# Patient Record
Sex: Male | Born: 1975 | Race: White | Hispanic: No | Marital: Married | Smoking: Never smoker
Health system: Southern US, Community
[De-identification: ages and names within clinical notes are randomized; demographics above are authoritative.]

## PROBLEM LIST (undated history)

## (undated) DIAGNOSIS — F419 Anxiety disorder, unspecified: Secondary | ICD-10-CM

## (undated) HISTORY — PX: APPENDECTOMY: SHX54

## (undated) HISTORY — PX: EYE SURGERY: SHX253

---

## 2018-07-18 ENCOUNTER — Other Ambulatory Visit: Payer: Self-pay

## 2018-07-18 ENCOUNTER — Encounter (HOSPITAL_COMMUNITY): Payer: Self-pay

## 2018-07-18 ENCOUNTER — Emergency Department (HOSPITAL_COMMUNITY): Payer: Managed Care, Other (non HMO)

## 2018-07-18 ENCOUNTER — Inpatient Hospital Stay (HOSPITAL_COMMUNITY)
Admission: EM | Admit: 2018-07-18 | Discharge: 2018-07-21 | DRG: 494 | Disposition: A | Discharge status: Home / Self Care | Payer: Managed Care, Other (non HMO) | Attending: Orthopaedic Surgery | Admitting: Orthopaedic Surgery

## 2018-07-18 DIAGNOSIS — M79661 Pain in right lower leg: Secondary | ICD-10-CM | POA: Diagnosis present

## 2018-07-18 DIAGNOSIS — S82251A Displaced comminuted fracture of shaft of right tibia, initial encounter for closed fracture: Secondary | ICD-10-CM | POA: Diagnosis present

## 2018-07-18 DIAGNOSIS — Z9049 Acquired absence of other specified parts of digestive tract: Secondary | ICD-10-CM

## 2018-07-18 DIAGNOSIS — S82451A Displaced comminuted fracture of shaft of right fibula, initial encounter for closed fracture: Secondary | ICD-10-CM | POA: Diagnosis present

## 2018-07-18 DIAGNOSIS — S82401A Unspecified fracture of shaft of right fibula, initial encounter for closed fracture: Secondary | ICD-10-CM

## 2018-07-18 DIAGNOSIS — Y92018 Other place in single-family (private) house as the place of occurrence of the external cause: Secondary | ICD-10-CM | POA: Diagnosis not present

## 2018-07-18 DIAGNOSIS — T148XXA Other injury of unspecified body region, initial encounter: Secondary | ICD-10-CM

## 2018-07-18 DIAGNOSIS — W108XXA Fall (on) (from) other stairs and steps, initial encounter: Secondary | ICD-10-CM | POA: Diagnosis present

## 2018-07-18 DIAGNOSIS — W19XXXA Unspecified fall, initial encounter: Secondary | ICD-10-CM

## 2018-07-18 DIAGNOSIS — S82201A Unspecified fracture of shaft of right tibia, initial encounter for closed fracture: Secondary | ICD-10-CM | POA: Diagnosis not present

## 2018-07-18 HISTORY — DX: Anxiety disorder, unspecified: F41.9

## 2018-07-18 MED ORDER — DIPHENHYDRAMINE HCL 12.5 MG/5ML PO ELIX: 12.5000 mg | ORAL_SOLUTION | ORAL | Status: DC | PRN

## 2018-07-18 MED ORDER — HYDROMORPHONE HCL 1 MG/ML IJ SOLN
1.0000 mg | INTRAMUSCULAR | Status: DC | PRN
  Administered 2018-07-18 – 2018-07-19 (×4): 1 mg via INTRAVENOUS
  Filled 2018-07-18 (×4): qty 1

## 2018-07-18 MED ORDER — PROPOFOL 10 MG/ML IV BOLUS
70.0000 mg | Freq: Once | INTRAVENOUS | Status: AC
  Administered 2018-07-18: 70 mg via INTRAVENOUS
  Filled 2018-07-18: qty 20

## 2018-07-18 MED ORDER — OXYCODONE HCL 5 MG PO TABS
5.0000 mg | ORAL_TABLET | ORAL | Status: DC | PRN
  Administered 2018-07-19 (×2): 10 mg via ORAL
  Filled 2018-07-18 (×2): qty 2

## 2018-07-18 MED ORDER — METHOCARBAMOL 500 MG PO TABS
500.0000 mg | ORAL_TABLET | Freq: Four times a day (QID) | ORAL | Status: DC | PRN
  Administered 2018-07-19 – 2018-07-21 (×6): 500 mg via ORAL
  Filled 2018-07-18 (×5): qty 1

## 2018-07-18 MED ORDER — SODIUM CHLORIDE 0.9 % IV SOLN
INTRAVENOUS | Status: DC
  Administered 2018-07-19: 16:00:00 via INTRAVENOUS

## 2018-07-18 MED ORDER — METHOCARBAMOL 1000 MG/10ML IJ SOLN
500.0000 mg | Freq: Four times a day (QID) | INTRAVENOUS | Status: DC | PRN
  Filled 2018-07-18: qty 5

## 2018-07-18 MED ORDER — PROPOFOL 10 MG/ML IV BOLUS
INTRAVENOUS | Status: AC | PRN
  Administered 2018-07-18: 35 mg via INTRAVENOUS

## 2018-07-18 MED ORDER — FENTANYL CITRATE (PF) 100 MCG/2ML IJ SOLN
50.0000 ug | INTRAMUSCULAR | Status: DC | PRN
  Administered 2018-07-18: 50 ug via INTRAVENOUS
  Filled 2018-07-18: qty 2

## 2018-07-18 NOTE — H&P (Signed)
Rick Wolf is an 43 y.o. male.   Chief Complaint: Right lower leg injury  HPI: 43 year old male who fell earlier this evening while carrying a small TV down stairs. Denies LOC, dizziness or other injury except the right lower leg. Patient arrive to ER with splint in place that was placed by EMS. Reports last meal at 7 PM tonight.   Past Medical History:  Diagnosis Date  . Anxiety     Past Surgical History:  Procedure Laterality Date  . APPENDECTOMY    . EYE SURGERY      History reviewed. No pertinent family history. Social History:  reports that he has never smoked. He has never used smokeless tobacco. He reports current alcohol use. He reports that he does not use drugs.  Allergies:  Allergies  Allergen Reactions  . Shellfish Allergy Anaphylaxis      No results found for this or any previous visit (from the past 48 hour(s)). Dg Tibia/fibula Right Port  Result Date: 07/18/2018 CLINICAL DATA:  Rick Wolf today, pain at mid tibia and fibula, deformity EXAM: PORTABLE RIGHT TIBIA AND FIBULA - 2 VIEW COMPARISON:  None FINDINGS: Osseous mineralization normal. Knee and ankle joint alignments normal. Oblique fracture of the RIGHT tibial diaphysis at the junction of the middle and distal thirds, with apex medial and anterior angulation and slight lateral displacement. Comminuted fracture of the RIGHT fibular diaphysis adjacent to the tibial fracture with similar anterior and medial apex angulation and slightly greater degree of displacement. No additional fractures, dislocation, or bone destruction. IMPRESSION: Displaced angulated fractures of the mid to distal RIGHT tibial and fibular diaphyses. Electronically Signed   By: Rick SouthwardMark  Wolf M.D.   On: 07/18/2018 21:21    Review of Systems  Constitutional: Negative for chills and fever.  Respiratory: Negative for shortness of breath.   Cardiovascular: Negative for chest pain.  Neurological: Negative for dizziness and loss of consciousness.     Blood pressure (!) 150/102, pulse (!) 105, temperature 98.1 F (36.7 C), temperature source Oral, resp. rate 16, height 6' (1.829 m), weight 95.3 kg, SpO2 99 %. Physical Exam  Constitutional: He is oriented to person, place, and time. He appears well-developed and well-nourished. No distress.  Cardiovascular: Intact distal pulses.  Respiratory: Effort normal.  Musculoskeletal:     Comments: Right lower leg obvious deformity of the distal tib-fib.  Tenting of the skin with valgus deformity.  Nontender at the ankle and right knee.  Compartments are soft throughout.  Sensation grossly intact light touch throughout the foot.  Neurological: He is alert and oriented to person, place, and time.  Skin: Skin is warm and dry. He is not diaphoretic.  Psychiatric: He has a normal mood and affect.     Assessment/Plan Distal right tib-fib closed  Fracture. Plan patient was consented for conscious sedation and closed reduction in the ER.  Close reduction was performed with Rick Wolf and long posterior splint with a stirrup was applied.  Patient tolerated well. We will plan for definitive treatment to tomorrow with an IM nailing of the  Right tibia.  Questions were encouraged and answered by Rick Wolf myself.  Patient was seen and examined by Rick Wolf.  Rick CanalGILBERT Kelis Plasse, PA-C 07/18/2018, 9:41 PM

## 2018-07-18 NOTE — ED Notes (Signed)
Ortho at bedside.

## 2018-07-18 NOTE — ED Triage Notes (Signed)
Pt walking down stairs, tripped on pill bottle and rolled R ankle. Obvious deformity to R ankle, splinted on scene. rec'd 150 mcgs fentanyl by EMS. +CMS to R foot on arrival.

## 2018-07-18 NOTE — ED Provider Notes (Signed)
[facility]   CSN: [id number] Arrival date & time: 07/18/18  2031     History   Chief Complaint Chief Complaint  Patient presents with  . Leg Pain    HPI Rick Wolf is a 43 y.o. male.  HPI Patient presents after mechanical fall with pain and deformity in his right lower leg. Patient was in his usual state of health, when he slipped, falling while going down the stairs. He felt sudden onset of pain, since that time has had discomfort in the mid to distal right leg, without loss of sensation. Patient received 100 mcg of fentanyl in route, notes that his pain is now 3/10. Pain is sharp, severe, worse with motion. Patient denies other trauma, injuries, other complaints, loss of consciousness. EMS notes the patient had more pronounced deformity on arrival, but after placement of splint had improved alignment of his right lower extremity.    Past medical history, anxiety Past surgical history appendectomy  Home Medications    Ativan, Zoloft    Family History No family history on file.  Social History Lives with wife, Non-smoker  Allergies   Patient has no allergy information on record.   Review of Systems Review of Systems  Constitutional:       Per HPI, otherwise negative  HENT:       Per HPI, otherwise negative  Respiratory:       Per HPI, otherwise negative  Cardiovascular:       Per HPI, otherwise negative  Gastrointestinal: Negative for vomiting.  Endocrine:       Negative aside from HPI  Genitourinary:       Neg aside from HPI   Musculoskeletal:       Per HPI, otherwise negative  Skin: Negative.   Neurological: Negative for syncope.     Physical Exam Updated Vital Signs BP (!) 143/90 (BP Location: Left Arm)   Pulse (!) 114   Temp 98.1 F (36.7 C) (Oral)   Resp 17   Ht 6' (1.829 m)   Wt 95.3 kg   SpO2 100%   BMI 28.48 kg/m   Physical Exam Vitals signs and nursing note reviewed.    Constitutional:      General: He is not in acute distress.    Appearance: He is well-developed.  HENT:     Head: Normocephalic and atraumatic.  Eyes:     Conjunctiva/sclera: Conjunctivae normal.  Cardiovascular:     Rate and Rhythm: Normal rate and regular rhythm.     Comments: Appreciable dorsalis pedis pulse,right foot, and posterior tibial pulse on same. Pulmonary:     Effort: Pulmonary effort is normal. No respiratory distress.     Breath sounds: No stridor.  Abdominal:     General: There is no distension.  Musculoskeletal:       Legs:  Skin:    General: Skin is warm and dry.  Neurological:     Mental Status: He is alert and oriented to person, place, and time.      ED Treatments / Results   Radiology Dg Tibia/fibula Right Port  Result Date: 07/18/2018 CLINICAL DATA:  [name], pain at mid tibia and fibula, deformity EXAM: PORTABLE RIGHT TIBIA AND FIBULA - 2 VIEW COMPARISON:  None FINDINGS: Osseous mineralization normal. Knee and ankle joint alignments normal. Oblique fracture of the RIGHT tibial diaphysis at the junction of the middle and distal thirds, with apex medial and anterior angulation and slight lateral displacement. Comminuted  fracture of the RIGHT fibular diaphysis adjacent to the tibial fracture with similar anterior and medial apex angulation and slightly greater degree of displacement. No additional fractures, dislocation, or bone destruction. IMPRESSION: Displaced angulated fractures of the mid to distal RIGHT tibial and fibular diaphyses. Electronically Signed   By: Ulyses Southward M.D.   On: 07/18/2018 21:21    Procedures .Sedation Date/Time: 07/18/2018 10:00 PM Performed by: Gerhard Munch, MD Authorized by: Gerhard Munch, MD   Consent:    Consent obtained:  Verbal   Consent given by:  Patient   Risks discussed:  Allergic reaction, dysrhythmia, inadequate sedation, nausea, prolonged hypoxia resulting in organ damage, prolonged sedation necessitating  reversal, respiratory compromise necessitating ventilatory assistance and intubation and vomiting   Alternatives discussed:  Analgesia without sedation, anxiolysis and regional anesthesia Universal protocol:    Procedure explained and questions answered to patient or proxy's satisfaction: yes     Relevant documents present and verified: yes     Test results available and properly labeled: yes     Imaging studies available: yes     Required blood products, implants, devices, and special equipment available: yes     Site/side marked: yes     Immediately prior to procedure a time out was called: yes     Patient identity confirmation method:  Verbally with patient Indications:    Procedure necessitating sedation performed by:  Physician performing sedation Pre-sedation assessment:    Time since last food or drink:  3   ASA classification: class 1 - normal, healthy patient     Neck mobility: normal     Mouth opening:  3 or more finger widths   Thyromental distance:  4 finger widths   Mallampati score:  I - soft palate, uvula, fauces, pillars visible   Pre-sedation assessments completed and reviewed: airway patency, cardiovascular function, hydration status, mental status, nausea/vomiting, pain level, respiratory function and temperature     Pre-sedation assessment completed:  07/18/2018 10:00 PM Immediate pre-procedure details:    Reassessment: Patient reassessed immediately prior to procedure     Reviewed: vital signs, relevant labs/tests and NPO status     Verified: bag valve mask available, emergency equipment available, intubation equipment available, IV patency confirmed, oxygen available and suction available   Procedure details (see MAR for exact dosages):    Preoxygenation:  Nasal cannula   Sedation:  Propofol   Intra-procedure monitoring:  Blood pressure monitoring, cardiac monitor, continuous pulse oximetry, frequent LOC assessments, frequent vital sign checks and continuous  capnometry   Intra-procedure events: none     Total Provider sedation time (minutes):  25 Post-procedure details:    Post-sedation assessment completed:  07/18/2018 11:00 PM   Attendance: Constant attendance by certified staff until patient recovered     Recovery: Patient returned to pre-procedure baseline     Post-sedation assessments completed and reviewed: airway patency, cardiovascular function, hydration status, mental status, nausea/vomiting, pain level, respiratory function and temperature     Patient is stable for discharge or admission: yes     Patient tolerance:  Tolerated well, no immediate complications   (including critical care time)  Medications Ordered in ED Medications  fentaNYL (SUBLIMAZE) injection 50 mcg (has no administration in time range)     Initial Impression / Assessment and Plan / ED Course  I have reviewed the triage vital signs and the nursing notes.  Pertinent labs & imaging results that were available during my care of the patient were reviewed by me and considered in  my medical decision making (see chart for details).  After my initial evaluation I reviewed the patient's x-ray, discussed with her orthopedist. Together, we discussed findings with the patient. Patient has had only few hours since dinner, is not medically clear for general anesthesia, operative repair. We discussed importance of reduction for minimization of risk of compartment syndrome, and the patient voiced understanding of risks and benefits of conscious sedation, with reduction. Patient tolerated this procedure well, with myself performing the sedation, the orthopedist performing the reduction Splint applied by orthopedic technician. Patient admitted to the orthopedic team for surgical repair tomorrow of his tib-fib fracture.  Final Clinical Impressions(s) / ED Diagnoses  Fall, initial encounter Fracture of tibia and fibula, right , initial encounter   Gerhard MunchLockwood, Eman Morimoto, MD 07/19/18  [phone]

## 2018-07-18 NOTE — Sedation Documentation (Signed)
Successful reduction by MD Magnus Ivan. Ortho Tech T Wall at bedside placing splint. Pt remains alert and conversational

## 2018-07-18 NOTE — Progress Notes (Signed)
Orthopedic Tech Progress Note Patient Details:  Rick Wolf 05-30-1976 [id number]  Ortho Devices Type of Ortho Device: Post (long) splint Ortho Device/Splint Interventions: Adjustment, Application, Ordered   Post Interventions Patient Tolerated: Well Instructions Provided: Adjustment of device, Care of device   Norva Karvonen T 07/18/2018, 9:37 PM

## 2018-07-18 NOTE — ED Notes (Signed)
Ortho tech paged per orthopedist request

## 2018-07-19 ENCOUNTER — Inpatient Hospital Stay (HOSPITAL_COMMUNITY): Payer: Managed Care, Other (non HMO)

## 2018-07-19 ENCOUNTER — Inpatient Hospital Stay (HOSPITAL_COMMUNITY): Payer: Managed Care, Other (non HMO) | Admitting: Certified Registered Nurse Anesthetist

## 2018-07-19 ENCOUNTER — Encounter (HOSPITAL_COMMUNITY)
Admission: EM | Disposition: A | Discharge status: Home / Self Care | Payer: Self-pay | Source: Home / Self Care | Attending: Orthopaedic Surgery

## 2018-07-19 ENCOUNTER — Encounter (HOSPITAL_COMMUNITY): Payer: Self-pay | Admitting: Emergency Medicine

## 2018-07-19 DIAGNOSIS — S82401A Unspecified fracture of shaft of right fibula, initial encounter for closed fracture: Secondary | ICD-10-CM

## 2018-07-19 DIAGNOSIS — S82201A Unspecified fracture of shaft of right tibia, initial encounter for closed fracture: Secondary | ICD-10-CM

## 2018-07-19 HISTORY — PX: TIBIA IM NAIL INSERTION: SHX2516

## 2018-07-19 LAB — CBC
HCT: 40.3 % (ref 39.0–52.0)
Hemoglobin: 13.9 g/dL (ref 13.0–17.0)
MCH: 30.4 pg (ref 26.0–34.0)
MCHC: 34.5 g/dL (ref 30.0–36.0)
MCV: 88.2 fL (ref 80.0–100.0)
Platelets: 271 10*3/uL (ref 150–400)
RBC: 4.57 MIL/uL (ref 4.22–5.81)
RDW: 11.9 % (ref 11.5–15.5)
WBC: 12.7 10*3/uL — ABNORMAL HIGH (ref 4.0–10.5)
nRBC: 0 % (ref 0.0–0.2)

## 2018-07-19 LAB — HIV ANTIBODY (ROUTINE TESTING W REFLEX): HIV Screen 4th Generation wRfx: NONREACTIVE

## 2018-07-19 LAB — COMPREHENSIVE METABOLIC PANEL
ALT: 27 U/L (ref 0–44)
AST: 23 U/L (ref 15–41)
Albumin: 3.7 g/dL (ref 3.5–5.0)
Alkaline Phosphatase: 50 U/L (ref 38–126)
Anion gap: 10 (ref 5–15)
BUN: 12 mg/dL (ref 6–20)
CO2: 24 mmol/L (ref 22–32)
Calcium: 8.7 mg/dL — ABNORMAL LOW (ref 8.9–10.3)
Chloride: 103 mmol/L (ref 98–111)
Creatinine, Ser: 1.01 mg/dL (ref 0.61–1.24)
GFR calc Af Amer: >60 mL/min (ref >60)
GFR calc non Af Amer: >60 mL/min (ref >60)
Glucose, Bld: 119 mg/dL — ABNORMAL HIGH (ref 70–99)
Potassium: 4.1 mmol/L (ref 3.5–5.1)
Sodium: 137 mmol/L (ref 135–145)
Total Bilirubin: 0.8 mg/dL (ref 0.3–1.2)
Total Protein: 6.7 g/dL (ref 6.5–8.1)

## 2018-07-19 LAB — MRSA PCR SCREENING: MRSA by PCR: NEGATIVE

## 2018-07-19 SURGERY — INSERTION, INTRAMEDULLARY ROD, TIBIA
Anesthesia: General | Laterality: Right

## 2018-07-19 MED ORDER — FENTANYL CITRATE (PF) 250 MCG/5ML IJ SOLN
INTRAMUSCULAR | Status: AC
  Filled 2018-07-19: qty 5

## 2018-07-19 MED ORDER — LIDOCAINE 2% (20 MG/ML) 5 ML SYRINGE
INTRAMUSCULAR | Status: AC
  Filled 2018-07-19: qty 5

## 2018-07-19 MED ORDER — DEXAMETHASONE SODIUM PHOSPHATE 10 MG/ML IJ SOLN
INTRAMUSCULAR | Status: DC | PRN
  Administered 2018-07-19: 10 mg via INTRAVENOUS

## 2018-07-19 MED ORDER — DEXAMETHASONE SODIUM PHOSPHATE 10 MG/ML IJ SOLN
INTRAMUSCULAR | Status: AC
  Filled 2018-07-19: qty 1

## 2018-07-19 MED ORDER — PHENYLEPHRINE 40 MCG/ML (10ML) SYRINGE FOR IV PUSH (FOR BLOOD PRESSURE SUPPORT)
PREFILLED_SYRINGE | INTRAVENOUS | Status: AC
  Filled 2018-07-19: qty 10

## 2018-07-19 MED ORDER — METHOCARBAMOL 500 MG PO TABS
ORAL_TABLET | ORAL | Status: AC
  Administered 2018-07-19: 500 mg
  Filled 2018-07-19: qty 1

## 2018-07-19 MED ORDER — FENTANYL CITRATE (PF) 100 MCG/2ML IJ SOLN: 50.0000 ug | INTRAMUSCULAR | Status: DC | PRN

## 2018-07-19 MED ORDER — BUPIVACAINE HCL (PF) 0.25 % IJ SOLN
INTRAMUSCULAR | Status: AC
  Filled 2018-07-19: qty 30

## 2018-07-19 MED ORDER — CEFAZOLIN SODIUM-DEXTROSE 2-3 GM-%(50ML) IV SOLR
INTRAVENOUS | Status: DC | PRN
  Administered 2018-07-19: 2 g via INTRAVENOUS

## 2018-07-19 MED ORDER — OXYCODONE HCL 5 MG PO TABS
ORAL_TABLET | ORAL | Status: AC
  Administered 2018-07-19: 5 mg
  Filled 2018-07-19: qty 2

## 2018-07-19 MED ORDER — LACTATED RINGERS IV SOLN
INTRAVENOUS | Status: DC | PRN
  Administered 2018-07-19 (×2): via INTRAVENOUS

## 2018-07-19 MED ORDER — ROCURONIUM BROMIDE 50 MG/5ML IV SOSY
PREFILLED_SYRINGE | INTRAVENOUS | Status: DC | PRN
  Administered 2018-07-19: 50 mg via INTRAVENOUS

## 2018-07-19 MED ORDER — CEFAZOLIN SODIUM-DEXTROSE 1-4 GM/50ML-% IV SOLN
1.0000 g | Freq: Three times a day (TID) | INTRAVENOUS | Status: AC
  Administered 2018-07-19 (×2): 1 g via INTRAVENOUS
  Filled 2018-07-19 (×2): qty 50

## 2018-07-19 MED ORDER — PHENYLEPHRINE 40 MCG/ML (10ML) SYRINGE FOR IV PUSH (FOR BLOOD PRESSURE SUPPORT)
PREFILLED_SYRINGE | INTRAVENOUS | Status: DC | PRN
  Administered 2018-07-19: 40 ug via INTRAVENOUS

## 2018-07-19 MED ORDER — ONDANSETRON HCL 4 MG/2ML IJ SOLN
INTRAMUSCULAR | Status: AC
  Filled 2018-07-19: qty 2

## 2018-07-19 MED ORDER — KETOROLAC TROMETHAMINE 15 MG/ML IJ SOLN
INTRAMUSCULAR | Status: AC
  Administered 2018-07-19: 15 mg
  Filled 2018-07-19: qty 1

## 2018-07-19 MED ORDER — FENTANYL CITRATE (PF) 250 MCG/5ML IJ SOLN
INTRAMUSCULAR | Status: DC | PRN
  Administered 2018-07-19 (×2): 50 ug via INTRAVENOUS
  Administered 2018-07-19: 100 ug via INTRAVENOUS
  Administered 2018-07-19: 50 ug via INTRAVENOUS

## 2018-07-19 MED ORDER — MIDAZOLAM HCL 2 MG/2ML IJ SOLN
INTRAMUSCULAR | Status: AC
  Filled 2018-07-19: qty 2

## 2018-07-19 MED ORDER — LIDOCAINE 2% (20 MG/ML) 5 ML SYRINGE
INTRAMUSCULAR | Status: DC | PRN
  Administered 2018-07-19: 60 mg via INTRAVENOUS

## 2018-07-19 MED ORDER — 0.9 % SODIUM CHLORIDE (POUR BTL) OPTIME
TOPICAL | Status: DC | PRN
  Administered 2018-07-19: 1000 mL

## 2018-07-19 MED ORDER — PROPOFOL 10 MG/ML IV BOLUS
INTRAVENOUS | Status: AC
  Filled 2018-07-19: qty 20

## 2018-07-19 MED ORDER — ONDANSETRON HCL 4 MG/2ML IJ SOLN
INTRAMUSCULAR | Status: DC | PRN
  Administered 2018-07-19: 4 mg via INTRAVENOUS

## 2018-07-19 MED ORDER — PROPOFOL 10 MG/ML IV BOLUS
INTRAVENOUS | Status: DC | PRN
  Administered 2018-07-19: 160 mg via INTRAVENOUS

## 2018-07-19 MED ORDER — BUPIVACAINE HCL (PF) 0.5 % IJ SOLN
INTRAMUSCULAR | Status: AC
  Filled 2018-07-19: qty 30

## 2018-07-19 MED ORDER — KETOROLAC TROMETHAMINE 15 MG/ML IJ SOLN
15.0000 mg | Freq: Four times a day (QID) | INTRAMUSCULAR | Status: DC
  Administered 2018-07-19 – 2018-07-21 (×8): 15 mg via INTRAVENOUS
  Filled 2018-07-19 (×8): qty 1

## 2018-07-19 MED ORDER — MIDAZOLAM HCL 5 MG/5ML IJ SOLN
INTRAMUSCULAR | Status: DC | PRN
  Administered 2018-07-19: 2 mg via INTRAVENOUS

## 2018-07-19 MED ORDER — SUGAMMADEX SODIUM 200 MG/2ML IV SOLN
INTRAVENOUS | Status: DC | PRN
  Administered 2018-07-19: 200 mg via INTRAVENOUS

## 2018-07-19 MED ORDER — OXYCODONE HCL 5 MG PO TABS
5.0000 mg | ORAL_TABLET | ORAL | Status: DC | PRN
  Administered 2018-07-19 – 2018-07-21 (×10): 10 mg via ORAL
  Filled 2018-07-19 (×9): qty 2

## 2018-07-19 MED ORDER — CEFAZOLIN SODIUM-DEXTROSE 2-4 GM/100ML-% IV SOLN
INTRAVENOUS | Status: AC
  Filled 2018-07-19: qty 100

## 2018-07-19 SURGICAL SUPPLY — 59 items
BANDAGE ACE 4X5 VEL STRL LF (GAUZE/BANDAGES/DRESSINGS) ×3 IMPLANT
BANDAGE ACE 6X5 VEL STRL LF (GAUZE/BANDAGES/DRESSINGS) ×3 IMPLANT
BIT DRILL AO GAMMA 4.2X180 (BIT) ×3 IMPLANT
BIT DRILL AO GAMMA 4.2X340 (BIT) ×3 IMPLANT
COVER MAYO STAND STRL (DRAPES) ×3 IMPLANT
COVER SURGICAL LIGHT HANDLE (MISCELLANEOUS) ×3 IMPLANT
COVER WAND RF STERILE (DRAPES) ×3 IMPLANT
DRAPE C-ARM 42X72 X-RAY (DRAPES) ×3 IMPLANT
DRAPE HALF SHEET 40X57 (DRAPES) ×6 IMPLANT
DRAPE IMP U-DRAPE 54X76 (DRAPES) ×3 IMPLANT
DRAPE ORTHO SPLIT 77X108 STRL (DRAPES) ×2
DRAPE SURG ORHT 6 SPLT 77X108 (DRAPES) ×1 IMPLANT
DURAPREP 26ML APPLICATOR (WOUND CARE) ×6 IMPLANT
ELECT REM PT RETURN 9FT ADLT (ELECTROSURGICAL) ×3
ELECTRODE REM PT RTRN 9FT ADLT (ELECTROSURGICAL) ×1 IMPLANT
GAUZE SPONGE 4X4 12PLY STRL (GAUZE/BANDAGES/DRESSINGS) ×3 IMPLANT
GAUZE XEROFORM 5X9 LF (GAUZE/BANDAGES/DRESSINGS) ×3 IMPLANT
GLOVE BIO SURGEON STRL SZ8 (GLOVE) ×3 IMPLANT
GLOVE BIOGEL PI IND STRL 8 (GLOVE) ×2 IMPLANT
GLOVE BIOGEL PI INDICATOR 8 (GLOVE) ×4
GLOVE ORTHO TXT STRL SZ7.5 (GLOVE) ×3 IMPLANT
GOWN STRL REUS W/ TWL XL LVL3 (GOWN DISPOSABLE) ×3 IMPLANT
GOWN STRL REUS W/TWL XL LVL3 (GOWN DISPOSABLE) ×6
GUIDEROD T2 3X1000 (ROD) ×3 IMPLANT
GUIDEWIRE GAMMA (WIRE) ×6 IMPLANT
K-WIRE FIXATION 3X285 COATED (WIRE) ×6
KIT BASIN OR (CUSTOM PROCEDURE TRAY) ×3 IMPLANT
KIT TURNOVER KIT B (KITS) ×3 IMPLANT
KWIRE FIXATION 3X285 COATED (WIRE) ×2 IMPLANT
NAIL ELAS INSERT SLV SPI 8-11 (MISCELLANEOUS) ×3 IMPLANT
NAIL TIBIAL STANDARD 11X375 (Nail) ×3 IMPLANT
NAIL TIBIAL STD 11X375 (Nail) ×1 IMPLANT
NS IRRIG 1000ML POUR BTL (IV SOLUTION) ×3 IMPLANT
PACK ORTHO EXTREMITY (CUSTOM PROCEDURE TRAY) ×3 IMPLANT
PACK UNIVERSAL I (CUSTOM PROCEDURE TRAY) ×3 IMPLANT
PAD ABD 8X10 STRL (GAUZE/BANDAGES/DRESSINGS) ×6 IMPLANT
PAD ARMBOARD 7.5X6 YLW CONV (MISCELLANEOUS) ×3 IMPLANT
PAD CAST 4YDX4 CTTN HI CHSV (CAST SUPPLIES) ×1 IMPLANT
PADDING CAST COTTON 4X4 STRL (CAST SUPPLIES) ×2
REAMER INTRAMEDULLARY 8MM 510 (MISCELLANEOUS) ×3 IMPLANT
SCREW LOCKING T2 F/T  5MMX40MM (Screw) ×2 IMPLANT
SCREW LOCKING T2 F/T  5MMX50MM (Screw) ×2 IMPLANT
SCREW LOCKING T2 F/T  5X37.5MM (Screw) ×2 IMPLANT
SCREW LOCKING T2 F/T 5MMX40MM (Screw) ×1 IMPLANT
SCREW LOCKING T2 F/T 5MMX50MM (Screw) ×1 IMPLANT
SCREW LOCKING T2 F/T 5X37.5MM (Screw) ×1 IMPLANT
STAPLER VISISTAT 35W (STAPLE) ×3 IMPLANT
STOCKINETTE IMPERVIOUS LG (DRAPES) ×3 IMPLANT
SUT VIC AB 0 CT1 27 (SUTURE) ×2
SUT VIC AB 0 CT1 27XBRD ANBCTR (SUTURE) ×1 IMPLANT
SUT VIC AB 1 CT1 27 (SUTURE) ×2
SUT VIC AB 1 CT1 27XBRD ANBCTR (SUTURE) ×1 IMPLANT
SUT VIC AB 2-0 CT1 27 (SUTURE) ×2
SUT VIC AB 2-0 CT1 TAPERPNT 27 (SUTURE) ×1 IMPLANT
TOWEL OR 17X26 10 PK STRL BLUE (TOWEL DISPOSABLE) ×3 IMPLANT
TUBE CONNECTING 12'X1/4 (SUCTIONS) ×1
TUBE CONNECTING 12X1/4 (SUCTIONS) ×2 IMPLANT
WATER STERILE IRR 1000ML POUR (IV SOLUTION) ×3 IMPLANT
YANKAUER SUCT BULB TIP NO VENT (SUCTIONS) ×3 IMPLANT

## 2018-07-19 NOTE — Brief Op Note (Signed)
07/18/2018 - 07/19/2018  11:43 AM  PATIENT:  Rick Wolf  43 y.o. male  PRE-OPERATIVE DIAGNOSIS:  RIGHT TIBIA-FIBULA FRACTURE  POST-OPERATIVE DIAGNOSIS:  RIGHT TIBIA-FIBULA FRACTURE  PROCEDURE:  Procedure(s): INTRAMEDULLARY (IM) NAIL TIBIAL, RIGHT (Right)  SURGEON:  Surgeon(s) and Role:    * Kathryne Hitch, MD - Primary  PHYSICIAN ASSISTANT: Rexene Edison, PA-C  ANESTHESIA:   local and general  EBL:  100 mL   COUNTS:  YES  DICTATION: .Other Dictation: Dictation Number [id number]  PLAN OF CARE: Admit to inpatient   PATIENT DISPOSITION:  PACU - hemodynamically stable.   Delay start of Pharmacological VTE agent (>24hrs) due to surgical blood loss or risk of bleeding: no

## 2018-07-19 NOTE — Anesthesia Postprocedure Evaluation (Signed)
Anesthesia Post Note  Patient: Rick Wolf  Procedure(s) Performed: INTRAMEDULLARY (IM) NAIL TIBIAL, RIGHT (Right )     Patient location during evaluation: PACU Anesthesia Type: General Level of consciousness: awake Pain management: pain level controlled Vital Signs Assessment: post-procedure vital signs reviewed and stable Respiratory status: spontaneous breathing Cardiovascular status: stable Postop Assessment: no apparent nausea or vomiting Anesthetic complications: no    Last Vitals:  Vitals:   07/19/18 1231 07/19/18 1256  BP: (!) 142/87 122/81  Pulse: (!) 103 92  Resp: 17   Temp: 36.7 C 36.7 C  SpO2: 96% 97%    Last Pain:  Vitals:   07/19/18 1256  TempSrc: Oral  PainSc:                  Lanaysia Fritchman

## 2018-07-19 NOTE — Progress Notes (Signed)
Patient ID: Rick Wolf, male   DOB: October 02, 1975, 43 y.o.   MRN: [id number] The patient understands fully that we will proceed to surgery today for definitive fixation of his right tibia fracture with an IM nail/rod.  The surgery has been explained in detail as well as the risks and benefits involved.  He is comfortable appearing this am and denies right foot numbness.  He moves his toes and they are well perfused.

## 2018-07-19 NOTE — Anesthesia Postprocedure Evaluation (Signed)
Anesthesia Post Note  Patient: Rick Wolf  Procedure(s) Performed: INTRAMEDULLARY (IM) NAIL TIBIAL, RIGHT (Right )     Patient location during evaluation: PACU Anesthesia Type: General Level of consciousness: awake Pain management: pain level controlled Vital Signs Assessment: post-procedure vital signs reviewed and stable Respiratory status: spontaneous breathing Cardiovascular status: stable Postop Assessment: no apparent nausea or vomiting Anesthetic complications: no    Last Vitals:  Vitals:   07/19/18 1231 07/19/18 1256  BP: (!) 142/87 122/81  Pulse: (!) 103 92  Resp: 17   Temp: 36.7 C 36.7 C  SpO2: 96% 97%    Last Pain:  Vitals:   07/19/18 1256  TempSrc: Oral  PainSc:                  [name]     

## 2018-07-19 NOTE — Anesthesia Procedure Notes (Signed)
Procedure Name: Intubation Date/Time: 07/19/2018 10:14 AM Performed by: Nils Pyle, CRNA Pre-anesthesia Checklist: Patient identified, Emergency Drugs available, Suction available and Patient being monitored Patient Re-evaluated:Patient Re-evaluated prior to induction Oxygen Delivery Method: Circle System Utilized Preoxygenation: Pre-oxygenation with 100% oxygen Induction Type: IV induction Ventilation: Mask ventilation without difficulty and Oral airway inserted - appropriate to patient size Laryngoscope Size: Hyacinth Meeker and 2 Grade View: Grade I Tube type: Oral Tube size: 7.5 mm Number of attempts: 1 Airway Equipment and Method: Stylet and Oral airway Placement Confirmation: ETT inserted through vocal cords under direct vision,  positive ETCO2 and breath sounds checked- equal and bilateral Secured at: 23 cm Tube secured with: Tape Dental Injury: Teeth and Oropharynx as per pre-operative assessment

## 2018-07-19 NOTE — Anesthesia Preprocedure Evaluation (Addendum)
Anesthesia Evaluation  Patient identified by MRN, date of birth, ID band Patient awake  General Assessment Comment:History noted   Reviewed: Allergy & Precautions, NPO status , Patient's Chart, lab work & pertinent test results  Airway Mallampati: II  TM Distance: >3 FB     Dental   Pulmonary neg pulmonary ROS,    breath sounds clear to auscultation       Cardiovascular negative cardio ROS   Rhythm:Regular Rate:Normal     Neuro/Psych    GI/Hepatic negative GI ROS, Neg liver ROS,   Endo/Other  negative endocrine ROS  Renal/GU negative Renal ROS     Musculoskeletal   Abdominal   Peds  Hematology   Anesthesia Other Findings   Reproductive/Obstetrics                            Anesthesia Physical Anesthesia Plan  ASA: II  Anesthesia Plan: General   Post-op Pain Management:    Induction: Intravenous  PONV Risk Score and Plan: Ondansetron, Dexamethasone and Midazolam  Airway Management Planned: Oral ETT  Additional Equipment:   Intra-op Plan:   Post-operative Plan: Possible Post-op intubation/ventilation  Informed Consent: I have reviewed the patients History and Physical, chart, labs and discussed the procedure including the risks, benefits and alternatives for the proposed anesthesia with the patient or authorized representative who has indicated his/her understanding and acceptance.     Dental advisory given  Plan Discussed with: CRNA, Anesthesiologist and Surgeon  Anesthesia Plan Comments:        Anesthesia Quick Evaluation

## 2018-07-19 NOTE — Transfer of Care (Signed)
Immediate Anesthesia Transfer of Care Note  Patient: Rick Wolf  Procedure(s) Performed: INTRAMEDULLARY (IM) NAIL TIBIAL, RIGHT (Right )  Patient Location: PACU  Anesthesia Type:General  Level of Consciousness: awake, alert  and oriented  Airway & Oxygen Therapy: Patient Spontanous Breathing  Post-op Assessment: Report given to RN, Post -op Vital signs reviewed and stable and Patient moving all extremities X 4  Post vital signs: Reviewed and stable  Last Vitals:  Vitals Value Taken Time  BP 135/81 07/19/2018 12:00 PM  Temp    Pulse 101 07/19/2018 12:00 PM  Resp 14 07/19/2018 12:00 PM  SpO2 94 % 07/19/2018 12:00 PM  Vitals shown include unvalidated device data.  Last Pain:  Vitals:   07/19/18 0647  TempSrc:   PainSc: 0-No pain         Complications: No apparent anesthesia complications

## 2018-07-20 NOTE — Op Note (Signed)
NAMENEON, VANDERWALL MEDICAL RECORD [id number] ACCOUNT [id number] DATE OF BIRTH:Feb 05, 1976 FACILITY: [facility] LOCATION: [facility] PHYSICIAN:Terrisa Curfman Aretha Parrot, MD  OPERATIVE REPORT  DATE OF PROCEDURE:  07/19/2018  PREOPERATIVE DIAGNOSIS:  Right tibia/fibula shaft fracture.  POSTOPERATIVE DIAGNOSIS:  Right tibia/fibula shaft fracture.  PROCEDURE:  Intramedullary nail placement, right tibia.  IMPLANTS:  Stryker T2 suprapatellar approach tibial nail measuring 11 cm x 375 cm, 1 proximal and 2 distal interlocking screws.  SURGEON:  Vanita Panda. Magnus Ivan, MD  ASSISTANT:  Richardean Canal, PA-C.  ANESTHESIA:  General.  ANTIBIOTICS:  Two grams IV Ancef.  ESTIMATED BLOOD LOSS:  200 mL.  COMPLICATIONS:  None.  INDICATIONS:  The patient is a 43 year old gentleman who unfortunately fell on the stairs yesterday injuring his right leg.  We saw him immediately afterwards in  the emergency room.  He had no evidence of compartment syndrome.  He did have a midshaft to  distal third shaft tib-fib fracture.  This was closed, but was tenting the skin.  He had just eaten prior to his accident, so we had a full stomach.  With no evidence of compartment syndrome, we did give him a little bit of sedation and improve the  alignment of the bone.  He had no soft tissue injuries last night.  His foot remained well perfused with normal sensation.  He can bend his toes back and forth in a splint.  We set him up for surgery then for this morning and admitted him overnight to  watch him with close neurovascular checks to make sure he did not develop compartment syndrome.  I felt with low mechanism of injury, it likely would not develop; however, we did watch him closely.  He is now presenting for definitive surgery this  morning.  He still denies any numbness and tingling in his foot.  His right foot was well perfusing, moves his toes easily.  I did explain in detail what the surgery involves.  We talked about  intramedullary nail placement versus ____.    We talked about  the risk of nonoperative and operative treatment as well as the risks of surgery itself.  DESCRIPTION OF PROCEDURE:  After a thorough discussion was had and informed consent was obtained, we marked the right leg and he was brought back to the operating room and placed supine on the operating table.  General anesthesia was then obtained.  His  right thigh, knee, leg, ankle and foot were all prepped and draped with DuraPrep and sterile drapes.    Of note, we took the splint down, he did have fracture blisters that had developed at the original site where the skin was tented from the original fracture.  We did unroof those blisters and cleaned thoroughly.    A time-out was called and he was identified as the correct patient, correct right tibia.  We then made our suprapatellar approach to the tibia making an incision just proximal to the superior pole of the patella.  We dissected down the knee joint and he  did have a significantly large hemarthrosis.  We irrigated this out.  Using the soft tissue protection guide, we then were able to place a temporary pin through the top of the tibia.  We verified its placement under AP and lateral planes using  fluoroscopic guidance.  We then were able to use initiating reamer to open up the proximal tibia.  We then placed a temporary guide rod all the way down the tibia traversing the fracture site into the  ankle.  Down at the ankle, we confirmed placement and  then the fracture, infection, reduction.  We then began reaming in 5 mm increments from 9 mm all the way up to 12.5 mm  because as we got to 11 mm, he did not have any significant chatter, so this did show that his tibial canal was certainly more open.   I felt it was more  appropriate then to ream to a 12.5 diameter for an 11 mm diameter nail.  We did ream all the way up to a 12.5.  We then took a measurement off of the guide pin at the top of the  tibia and we chose our 11 mm x 375 mm tibial nail.  We  placed this nail over the guide rod without difficulty and held the fracture reduced past the fracture site and then removed the guide rod.  We then used the outrigger guide proximally and put a single screw from lateral to medial and then distally, we  did 2 distal interlocking screws from medial to lateral.  I felt that his rotational stability was on and 3 interlocks were done that were needed because of very tight fill in the canal.  We then irrigated all wounds with normal saline solution.  We  closed the quad tendon with #1 Vicryl suture followed by 0 Vicryl in the deep tissue, 2-0 Vicryl subcutaneous tissue and interrupted staples on all skin incisions.  Xeroform well-padded soft dressing was applied.  He was not placed back in a type of  splint.    He was then awakened, extubated, and taken to recovery room in stable condition.    All final counts were correct.    There were no complications noted.    Note Rexene Edison, PA-C, assisted during the entire case and assistance was crucial for facilitating all aspects of this case.    Postoperatively, we are going to let the patient potentially attempt weightbearing as tolerated and it depends on his level of comfort.  AN/NUANCE  D:07/19/2018 T:07/20/2018 JOB:[id number]

## 2018-07-20 NOTE — Progress Notes (Signed)
Occupational Therapy Evaluation Patient Details Name: Rick Wolf MRN: [id number] DOB: 04-01-1976 Today's Date: 07/20/2018    History of Present Illness Patient is a 43 year old male admitted after fall on steps at home. Sustained a R tib/fib fracture, S/P ORIF.     Clinical Impression   PTA pt PLOF I in all ADLs and IADLs, lives with wife and family. Pt currently requires Min Guard for functional transfer for safety due to limitation of RLE. Supervision LB ADLs. Pt will benefit from continued acute therapy for education for home safety and compensatory techniques maximize independence in ADLs and transition to home setting safely. No OT follow up required. OT will continue to follow acutely.    Follow Up Recommendations  No OT follow up    Equipment Recommendations       Recommendations for Other Services       Precautions / Restrictions Precautions Precautions: Fall Restrictions Weight Bearing Restrictions: Yes RLE Weight Bearing: Weight bearing as tolerated      Mobility Bed Mobility Overal bed mobility: Independent             General bed mobility comments: Pt able to demonstrate good bed mobiltiy no signs of low strength or pain.   Transfers Overall transfer level: Needs assistance Equipment used: Rolling walker (2 wheeled) Transfers: Sit to/from Stand Sit to Stand: Min guard         General transfer comment: VCs for hand placement and sequeicng to power to stand with RW.    Balance Overall balance assessment: Needs assistance Sitting-balance support: No upper extremity supported Sitting balance-Leahy Scale: Good     Standing balance support: Bilateral upper extremity supported Standing balance-Leahy Scale: (Pt reliant of RW.)                             ADL either performed or assessed with clinical judgement   ADL Overall ADL's : Needs assistance/impaired Eating/Feeding: Independent   Grooming: Wash/dry hands;Oral  care;Independent               Lower Body Dressing: Supervision/safety;Sitting/lateral leans   Toilet Transfer: Min guard;Cueing for safety;Ambulation;RW   Toileting- Clothing Manipulation and Hygiene: Modified independent       Functional mobility during ADLs: Min guard;Cueing for sequencing;Rolling walker General ADL Comments: Pt able to bear weight on RLE.Min guard for safety patient able to follow commands of sequencing with 1 reminder.     Vision         Perception     Praxis      Pertinent Vitals/Pain Pain Assessment: 0-10 Pain Score: 3  Faces Pain Scale: Hurts little more Pain Descriptors / Indicators: Aching;Discomfort Pain Intervention(s): Monitored during session;Premedicated before session     Hand Dominance     Extremity/Trunk Assessment Upper Extremity Assessment Upper Extremity Assessment: Overall WFL for tasks assessed   Lower Extremity Assessment Lower Extremity Assessment: Defer to PT evaluation   Cervical / Trunk Assessment Cervical / Trunk Assessment: Normal   Communication Communication Communication: No difficulties   Cognition Arousal/Alertness: Awake/alert Behavior During Therapy: WFL for tasks assessed/performed Overall Cognitive Status: Within Functional Limits for tasks assessed                                     General Comments       Exercises     Shoulder Instructions  Home Living Family/patient expects to be discharged to:: Private residence Living Arrangements: Spouse/significant other   Type of Home: House Home Access: Level entry     Home Layout: Two level;Able to live on main level with bedroom/bathroom     Bathroom Shower/Tub: Producer, television/film/video: Handicapped height Bathroom Accessibility: Yes   Home Equipment: Shower seat(Shower bench built in to shower.)   Additional Comments: Dan Humphreys is able to maneuver in bathroom.      Prior Functioning/Environment Level of  Independence: Independent                 OT Problem List: Impaired balance (sitting and/or standing);Decreased safety awareness;Decreased knowledge of use of DME or AE;Decreased knowledge of precautions;Pain      OT Treatment/Interventions:      OT Goals(Current goals can be found in the care plan section) Acute Rehab OT Goals Patient Stated Goal: to return home OT Goal Formulation: With patient Time For Goal Achievement: 08/03/18 Potential to Achieve Goals: Good  OT Frequency: Min 2X/week   Barriers to D/C:            Co-evaluation              AM-PAC OT "6 Clicks" Daily Activity     Outcome Measure Help from another person eating meals?: None Help from another person taking care of personal grooming?: None Help from another person toileting, which includes using toliet, bedpan, or urinal?: None Help from another person bathing (including washing, rinsing, drying)?: None Help from another person to put on and taking off regular upper body clothing?: None Help from another person to put on and taking off regular lower body clothing?: A Little 6 Click Score: 23   End of Session Equipment Utilized During Treatment: Gait belt;Rolling walker Nurse Communication: Mobility status;Weight bearing status  Activity Tolerance: Patient tolerated treatment well Patient left: in bed(with PT.)  OT Visit Diagnosis: Unsteadiness on feet (R26.81);Other abnormalities of gait and mobility (R26.89);Pain Pain - Right/Left: Right Pain - part of body: Leg                Time: 4431-5400 OT Time Calculation (min): 19 min Charges:  OT General Charges $OT Visit: 1 Visit OT Evaluation $OT Eval Low Complexity: 1 Low  [place] Old, MSOT, OTR/L  Supplemental Rehabilitation Services  [phone]  Zigmund Daniel 07/20/2018, 2:07 PM

## 2018-07-20 NOTE — Plan of Care (Signed)
  Problem: Education: Goal: Knowledge of the prescribed therapeutic regimen will improve Outcome: Progressing   Problem: Activity: Goal: Ability to increase mobility will improve Outcome: Progressing   Problem: Physical Regulation: Goal: Postoperative complications will be avoided or minimized Outcome: Progressing   Problem: Skin Integrity: Goal: Will show signs of wound healing Outcome: Progressing

## 2018-07-20 NOTE — Evaluation (Signed)
Physical Therapy Evaluation Patient Details Name: Rick Wolf MRN: [id number] DOB: [birth year] Today's Date: 07/20/2018   History of Present Illness  Patient is a 43 year old male admitted after fall on steps at home. Sustained a R tib/fib fracture, S/P ORIF.    Clinical Impression  Patient standing at bathroom sink with OT upon arrival. Patient reports minimal pain in right LE. Able to tolerate some weight bearing. Transfers sit to stand with min guard.  Agrees to walk in hallway 200' with rw and supervision, cues for sequencing needed. Good balance and safety awareness demonstrated. Patient will benefit from continued PT while here to improve functional independence, strength and safety with AD for return home.       Follow Up Recommendations No PT follow up    Equipment Recommendations  Crutches;Rolling walker with 5" wheels(dependent on progress, preference)    Recommendations for Other Services       Precautions / Restrictions Precautions Precautions: Fall Restrictions Weight Bearing Restrictions: Yes RLE Weight Bearing: Weight bearing as tolerated      Mobility  Bed Mobility               General bed mobility comments: Not assessed, patient up with OT in bathroom when arrived. Remained in recliner at end of session.   Transfers Overall transfer level: Modified independent Equipment used: Rolling walker (2 wheeled)                Ambulation/Gait Ambulation/Gait assistance: Modified independent (Device/Increase time) Gait Distance (Feet): 200 Feet Assistive device: Rolling walker (2 wheeled) Gait Pattern/deviations: Step-to pattern;Decreased stance time - right;Decreased weight shift to right Gait velocity: decreased      Stairs            Wheelchair Mobility    Modified Rankin (Stroke Patients Only)       Balance Overall balance assessment: Modified Independent                                           Pertinent  Vitals/Pain Pain Assessment: Faces Faces Pain Scale: Hurts little more Pain Descriptors / Indicators: Aching;Discomfort Pain Intervention(s): Limited activity within patient's tolerance;Monitored during session;Ice applied    Home Living Family/patient expects to be discharged to:: Private residence Living Arrangements: Spouse/significant other   Type of Home: House Home Access: Level entry     Home Layout: Two level;Able to live on main level with bedroom/bathroom Home Equipment: Shower seat      Prior Function Level of Independence: Independent               Hand Dominance        Extremity/Trunk Assessment   Upper Extremity Assessment Upper Extremity Assessment: Overall WFL for tasks assessed    Lower Extremity Assessment Lower Extremity Assessment: Overall WFL for tasks assessed    Cervical / Trunk Assessment Cervical / Trunk Assessment: Normal  Communication   Communication: No difficulties  Cognition Arousal/Alertness: Awake/alert Behavior During Therapy: WFL for tasks assessed/performed Overall Cognitive Status: Within Functional Limits for tasks assessed                                        General Comments      Exercises     Assessment/Plan    PT Assessment Patient needs continued PT  services  PT Problem List Decreased strength;Decreased balance;Pain;Decreased mobility;Decreased activity tolerance;Decreased knowledge of use of DME       PT Treatment Interventions DME instruction;Functional mobility training;Balance training;Patient/family education;Gait training;Therapeutic activities;Neuromuscular re-education;Therapeutic exercise    PT Goals (Current goals can be found in the Care Plan section)  Acute Rehab PT Goals Patient Stated Goal: to return home PT Goal Formulation: With patient Time For Goal Achievement: 07/24/18 Potential to Achieve Goals: Good    Frequency Min 5X/week   Barriers to discharge         Co-evaluation               AM-PAC PT "6 Clicks" Mobility  Outcome Measure Help needed turning from your back to your side while in a flat bed without using bedrails?: A Little Help needed moving from lying on your back to sitting on the side of a flat bed without using bedrails?: A Little Help needed moving to and from a bed to a chair (including a wheelchair)?: A Little Help needed standing up from a chair using your arms (e.g., wheelchair or bedside chair)?: A Little Help needed to walk in hospital room?: A Little Help needed climbing 3-5 steps with a railing? : A Little 6 Click Score: 18    End of Session Equipment Utilized During Treatment: Gait belt Activity Tolerance: Patient tolerated treatment well;Patient limited by pain Patient left: in chair;with call bell/phone within reach Nurse Communication: Mobility status PT Visit Diagnosis: Muscle weakness (generalized) (M62.81);Difficulty in walking, not elsewhere classified (R26.2);Pain;History of falling (Z91.81) Pain - Right/Left: Right Pain - part of body: Leg    Time: 4628-6381 PT Time Calculation (min) (ACUTE ONLY): 27 min   Charges:   PT Evaluation $PT Eval Moderate Complexity: 1 Mod PT Treatments $Gait Training: 23-37 mins        Melida Northington, PT, GCS 07/20/18,1:35 PM

## 2018-07-20 NOTE — Plan of Care (Signed)
  Problem: Pain Managment: Goal: General experience of comfort will improve Outcome: Progressing   Problem: Safety: Goal: Ability to remain free from injury will improve Outcome: Progressing   

## 2018-07-21 ENCOUNTER — Encounter (HOSPITAL_COMMUNITY): Payer: Self-pay | Admitting: Orthopaedic Surgery

## 2018-07-21 MED ORDER — MAGNESIUM CITRATE PO SOLN
1.0000 | Freq: Once | ORAL | Status: AC
  Administered 2018-07-21: 1 via ORAL
  Filled 2018-07-21: qty 296

## 2018-07-21 MED ORDER — OXYCODONE HCL 5 MG PO TABS: 5.0000 mg | ORAL_TABLET | ORAL | 0 refills | Status: DC | PRN

## 2018-07-21 MED ORDER — METHOCARBAMOL 500 MG PO TABS: 500.0000 mg | ORAL_TABLET | Freq: Four times a day (QID) | ORAL | 0 refills | Status: AC | PRN

## 2018-07-21 MED ORDER — ASPIRIN EC 325 MG PO TBEC: 325.0000 mg | DELAYED_RELEASE_TABLET | Freq: Every day | ORAL | 0 refills | Status: DC

## 2018-07-21 NOTE — Discharge Instructions (Signed)
Increase your activities as comfort allows. You can try to put full weight on your right leg, but only with crutches or a walker. Expect leg swelling - ice and elevation as needed. Do pump your feet and ankles several times daily for circulation. Do get over the counter stool softeners to take as needed. You can get your dressings wet in the shower and even get your incisions wet. Changes dressings as needed. Do place some Neosporin ointment or triple antibiotic ointment on your fracture blister skin site every other day or even daily.

## 2018-07-21 NOTE — Progress Notes (Signed)
Occupational Therapy Treatment Patient Details Name: Rick Wolf MRN: [id number] DOB: [birth year] Today's Date: 07/21/2018    History of present illness Patient is a 43 year old male admitted after fall on steps at home. Sustained a R tib/fib fracture, S/P ORIF.     OT comments  Pt progressing towards established OT goals and is motivated to dc home today. Focused session on LB dressing and safe shower transfer. Educating pt on compensatory techniques for LB dressing. Pt donning socks and pants with Min Guard A for safety in standing. Providing education on shower transfer, and pt performed shower transfer with Min Guard A for safety demonstrating understanding. Answered all pt questions in preparation for dc home today. All acute OT needs met and will sign off. Thank you.   Follow Up Recommendations  No OT follow up    Equipment Recommendations       Recommendations for Other Services      Precautions / Restrictions Precautions Precautions: Fall Restrictions Weight Bearing Restrictions: Yes RLE Weight Bearing: Weight bearing as tolerated       Mobility Bed Mobility Overal bed mobility: Independent             General bed mobility comments: Pt able to demonstrate good bed mobiltiy no signs of low strength or pain.   Transfers Overall transfer level: Needs assistance Equipment used: Rolling walker (2 wheeled) Transfers: Sit to/from Stand Sit to Stand: Min guard         General transfer comment: Min Guard A for safety with initial posterior lean.     Balance Overall balance assessment: Needs assistance Sitting-balance support: No upper extremity supported Sitting balance-Leahy Scale: Good     Standing balance support: Bilateral upper extremity supported Standing balance-Leahy Scale: Fair Standing balance comment: Able to maintain static standing balance without UE support                           ADL either performed or assessed with clinical  judgement   ADL Overall ADL's : Needs assistance/impaired                     Lower Body Dressing: Min guard;Sit to/from stand Lower Body Dressing Details (indicate cue type and reason): Educating pt on LB dressing techniques and donning right leg first. Min Guard A for safety with slight posterior lean Toilet Transfer: Min guard;Ambulation;RW(simulated to recliner)       Tub/ Shower Transfer: Walk-in shower;Shower seat;Rolling walker;Ambulation;Cueing for sequencing;Min guard Tub/Shower Transfer Details (indicate cue type and reason): Educating pt on techniques for safe shower transfer. Min Guard A for safety. Pt demonstrating understanding. Functional mobility during ADLs: Min guard;Cueing for sequencing;Rolling walker General ADL Comments: Focused session on LB dressing and shower transfer. Pt performing at [place] level for safety. Pt motivated to dc home today.      Vision       Perception     Praxis      Cognition Arousal/Alertness: Awake/alert Behavior During Therapy: WFL for tasks assessed/performed Overall Cognitive Status: Within Functional Limits for tasks assessed                                          Exercises     Shoulder Instructions       General Comments      Pertinent Vitals/ Pain  Pain Assessment: Faces Faces Pain Scale: Hurts a little bit Pain Location: RLE Pain Descriptors / Indicators: Aching;Discomfort Pain Intervention(s): Limited activity within patient's tolerance;Monitored during session;Repositioned  Home Living                                          Prior Functioning/Environment              Frequency  Min 2X/week        Progress Toward Goals  OT Goals(current goals can now be found in the care plan section)  Progress towards OT goals: Goals met/education completed, patient discharged from OT  Acute Rehab OT Goals Patient Stated Goal: to return home OT Goal  Formulation: With patient Time For Goal Achievement: 08/03/18 Potential to Achieve Goals: Good ADL Goals Pt Will Perform Lower Body Bathing: with modified independence;with adaptive equipment;sit to/from stand Pt Will Perform Lower Body Dressing: with modified independence;with adaptive equipment;sit to/from stand Pt Will Transfer to Toilet: with modified independence;ambulating;regular height toilet Pt Will Perform Toileting - Clothing Manipulation and hygiene: with adaptive equipment;sit to/from stand;Independently Pt Will Perform Tub/Shower Transfer: Shower transfer;shower seat;ambulating;rolling walker  Plan Discharge plan remains appropriate;All goals met and education completed, patient discharged from OT services    Co-evaluation                 AM-PAC OT "6 Clicks" Daily Activity     Outcome Measure   Help from another person eating meals?: None Help from another person taking care of personal grooming?: None Help from another person toileting, which includes using toliet, bedpan, or urinal?: None Help from another person bathing (including washing, rinsing, drying)?: None Help from another person to put on and taking off regular upper body clothing?: None Help from another person to put on and taking off regular lower body clothing?: A Little 6 Click Score: 23    End of Session Equipment Utilized During Treatment: Rolling walker  OT Visit Diagnosis: Unsteadiness on feet (R26.81);Other abnormalities of gait and mobility (R26.89);Pain Pain - Right/Left: Right Pain - part of body: Leg   Activity Tolerance Patient tolerated treatment well   Patient Left in chair;with call bell/phone within reach;with nursing/sitter in room   Nurse Communication Mobility status;Weight bearing status        Time: 9381-8299 OT Time Calculation (min): 17 min  Charges: OT General Charges $OT Visit: 1 Visit OT Treatments $Self Care/Home Management : 8-22 mins  [place], OTR/L Acute Rehab Pager: [phone] Office: [place] 07/21/2018, 9:04 AM

## 2018-07-21 NOTE — Care Management Note (Signed)
Case Management Note  Patient Details  Name: Rick Wolf MRN: [id number] Date of Birth: June 04, 1976  Subjective/Objective:    43 yr old male admitted s/p fall resulting in a right tib/fib fracture, underwent a ORIF.                 Action/Plan:  Patient has no HH needs , DME has been ordered.    Expected Discharge Date:  07/21/18               Expected Discharge Plan:  Home/Self Care  In-House Referral:  NA  Discharge planning Services  CM Consult  Post Acute Care Choice:  Durable Medical Equipment Choice offered to:  NA  DME Arranged:  3-N-1, Walker rolling DME Agency:  [facility]  HH Arranged:  NA HH Agency:  NA  Status of Service:  Completed, signed off  If discussed at Long Length of Stay Meetings, dates discussed:    Additional Comments:  Durenda Guthrie, RN 07/21/2018, 11:50 AM

## 2018-07-21 NOTE — Progress Notes (Signed)
Physical Therapy Treatment Patient Details Name: Rick Wolf MRN: [id number] DOB: 04/10/1976 Today's Date: 07/21/2018    History of Present Illness Patient is a 43 year old male admitted after fall on steps at home. Sustained a R tib/fib fracture, S/P ORIF.      PT Comments    Pt is eager to work with therapy for discharge today. Pt is making good progress towards his goals however is limited in safe mobility by increased R LE pain and decreased R LE ROM. Pt currently supervision for transfers, min guard for ambulation of 200 feet and minA for ascent/descent of 4 step with RW. Pt given HEP of general LE exercises and is encourage to progress towards his normal gait pattern as pain reduces and mobility return to his R LE. Pt is ready for d/c home.    Follow Up Recommendations  No PT follow up     Equipment Recommendations  Rolling walker with 5" wheels    Recommendations for Other Services       Precautions / Restrictions Precautions Precautions: Fall Restrictions Weight Bearing Restrictions: Yes RLE Weight Bearing: Weight bearing as tolerated    Mobility  Bed Mobility               General bed mobility comments: OOB in recliner on entry   Transfers Overall transfer level: Needs assistance Equipment used: Rolling walker (2 wheeled) Transfers: Sit to/from Stand Sit to Stand: Min guard         General transfer comment: min guard for safety, vc for hand placement and sequencing  Ambulation/Gait Ambulation/Gait assistance: Min guard Gait Distance (Feet): 200 Feet Assistive device: Rolling walker (2 wheeled) Gait Pattern/deviations: Step-to pattern;Decreased stance time - right;Decreased weight shift to right;Step-through pattern;Decreased step length - left;Trunk flexed Gait velocity: decreased Gait velocity interpretation: <1.8 ft/sec, indicate of risk for recurrent falls General Gait Details: min guard for safety, able to progress from step to to step through  pattern, vc for increased UE support for advancing L LE, sequencing, decreased R step length    Stairs Stairs: Yes Stairs assistance: Min assist Stair Management: No rails;Backwards;Forwards;Step to pattern;With walker Number of Stairs: 4 General stair comments: min a for steadying with ascent/descent with RW, vc for sequencing and hand placement, pt wife able to reproduce steadying RW      Balance Overall balance assessment: Modified Independent Sitting-balance support: No upper extremity supported Sitting balance-Leahy Scale: Good     Standing balance support: Bilateral upper extremity supported Standing balance-Leahy Scale: Fair Standing balance comment: Able to maintain static standing balance without UE support                            Cognition Arousal/Alertness: Awake/alert Behavior During Therapy: WFL for tasks assessed/performed Overall Cognitive Status: Within Functional Limits for tasks assessed                                        Exercises General Exercises - Lower Extremity Ankle Circles/Pumps: AROM;Both;10 reps;Seated Quad Sets: AROM;Right;5 reps;Seated Short Arc Quad: AROM;Right;5 reps;Seated Long Arc Quad: AROM;Both;10 reps;Seated Heel Slides: AAROM;5 reps;Seated Hip ABduction/ADduction: AROM;Right;10 reps;Seated Straight Leg Raises: AROM;Right;10 reps;Seated    General Comments General comments (skin integrity, edema, etc.): bandages over surgical incisions, clean dry and intact      Pertinent Vitals/Pain Pain Assessment: Faces Faces Pain Scale: Hurts little more  Pain Location: RLE Pain Descriptors / Indicators: Aching;Discomfort Pain Intervention(s): Limited activity within patient's tolerance;Monitored during session;Repositioned           PT Goals (current goals can now be found in the care plan section) Acute Rehab PT Goals Patient Stated Goal: to return home PT Goal Formulation: With patient Time For Goal  Achievement: 07/24/18 Potential to Achieve Goals: Good Progress towards PT goals: Progressing toward goals    Frequency    Min 5X/week      PT Plan Current plan remains appropriate       AM-PAC PT "6 Clicks" Mobility   Outcome Measure  Help needed turning from your back to your side while in a flat bed without using bedrails?: A Little Help needed moving from lying on your back to sitting on the side of a flat bed without using bedrails?: A Little Help needed moving to and from a bed to a chair (including a wheelchair)?: A Little Help needed standing up from a chair using your arms (e.g., wheelchair or bedside chair)?: A Little Help needed to walk in hospital room?: A Little Help needed climbing 3-5 steps with a railing? : A Little 6 Click Score: 18    End of Session Equipment Utilized During Treatment: Gait belt Activity Tolerance: Patient tolerated treatment well;Patient limited by pain Patient left: in chair;with call bell/phone within reach Nurse Communication: Mobility status PT Visit Diagnosis: Muscle weakness (generalized) (M62.81);Difficulty in walking, not elsewhere classified (R26.2);Pain;History of falling (Z91.81) Pain - Right/Left: Right Pain - part of body: Leg     Time: [id number] PT Time Calculation (min) (ACUTE ONLY): 47 min  Charges:  $Gait Training: 23-37 mins $Therapeutic Exercise: 8-22 mins                     Rick Wolf PT, DPT Acute Rehabilitation Services Pager [phone] Office [phone]    Rick Wolf 07/21/2018, 10:03 AM

## 2018-07-21 NOTE — Discharge Summary (Signed)
Patient ID: Rick Wolf MRN: [id number] DOB/AGE: [birth year] 43 y.o.  Admit date: 07/18/2018 Discharge date: 07/21/2018  Admission Diagnoses:  Principal Problem:   Closed fracture of shaft of right tibia and fibula   Discharge Diagnoses:  Same  Past Medical History:  Diagnosis Date  . Anxiety     Surgeries: Procedure(s): INTRAMEDULLARY (IM) NAIL TIBIAL, RIGHT on 07/19/2018   Consultants:   Discharged Condition: Improved  Hospital Course: Rick Wolf is an 43 y.o. male who was admitted 07/18/2018 for operative treatment ofClosed fracture of shaft of right tibia and fibula. Patient has severe unremitting pain that affects sleep, daily activities, and work/hobbies. After pre-op clearance the patient was taken to the operating room on 07/19/2018 and underwent  Procedure(s): INTRAMEDULLARY (IM) NAIL TIBIAL, RIGHT.    Patient was given perioperative antibiotics:  Anti-infectives (From admission, onward)   Start     Dose/Rate Route Frequency Ordered Stop   07/19/18 1400  ceFAZolin (ANCEF) IVPB 1 g/50 mL premix     1 g 100 mL/hr over 30 Minutes Intravenous Every 8 hours 07/19/18 1254 07/19/18 2207   07/19/18 0942  ceFAZolin (ANCEF) 2-4 GM/100ML-% IVPB    Note to Pharmacy:  Lorenda Ishihara   : cabinet override      07/19/18 5176 07/19/18 2159       Patient was given sequential compression devices, early ambulation, and chemoprophylaxis to prevent DVT.  Patient benefited maximally from hospital stay and there were no complications.    Recent vital signs:  Patient Vitals for the past 24 hrs:  BP Temp Temp src Pulse Resp SpO2  07/21/18 1210 122/73 98.2 F (36.8 C) Oral 86 17 99 %  07/21/18 0434 111/72 98.6 F (37 C) Oral 80 15 98 %  07/20/18 2018 105/70 98.9 F (37.2 C) Oral 74 15 97 %     Recent laboratory studies:  Recent Labs    07/19/18 0409  WBC 12.7*  HGB 13.9  HCT 40.3  PLT 271  NA 137  K 4.1  CL 103  CO2 24  BUN 12  CREATININE 1.01  GLUCOSE 119*  CALCIUM 8.7*      Discharge Medications:   Allergies as of 07/21/2018      Reactions   Shellfish Allergy Anaphylaxis      Medication List    TAKE these medications   acetaminophen 325 MG tablet Commonly known as:  TYLENOL Take 325-650 mg by mouth every 6 (six) hours as needed (for pain or headaches).   aspirin EC 325 MG tablet Take 1 tablet (325 mg total) by mouth daily.   LORazepam 1 MG tablet Commonly known as:  ATIVAN Take 1 mg by mouth daily as needed for anxiety.   methocarbamol 500 MG tablet Commonly known as:  ROBAXIN Take 1 tablet (500 mg total) by mouth every 6 (six) hours as needed for muscle spasms.   oxyCODONE 5 MG immediate release tablet Commonly known as:  Oxy IR/ROXICODONE Take 1-2 tablets (5-10 mg total) by mouth every 4 (four) hours as needed for breakthrough pain.   sertraline 100 MG tablet Commonly known as:  ZOLOFT Take 100 mg by mouth daily.       Diagnostic Studies: Dg Tibia/fibula Right  Result Date: 07/19/2018 CLINICAL DATA:  Right tibial nail EXAM: DG C-ARM 61-120 MIN; RIGHT TIBIA AND FIBULA - 2 VIEW COMPARISON:  Right tib-fib radiographs dated 07/18/2018 FLUOROSCOPY TIME:  2 minutes, 36 seconds FINDINGS: Status post IM nail with single proximal interlocking and two distal interlocking screw  fixation of a distal tibial shaft fracture. Fracture fragments are in near anatomic alignment and position, with minimal lateral displacement. Comminuted/segmental distal fibular shaft fracture, in near anatomic alignment and position. IMPRESSION: Status post ORIF of a distal tibial shaft fracture. Comminuted distal fibular shaft fracture, with improved alignment. Electronically Signed   By: Charline Bills M.D.   On: 07/19/2018 14:57   Dg Tibia/fibula Right Port  Result Date: 07/18/2018 CLINICAL DATA:  Larey Seat today, pain at mid tibia and fibula, deformity EXAM: PORTABLE RIGHT TIBIA AND FIBULA - 2 VIEW COMPARISON:  None FINDINGS: Osseous mineralization normal. Knee and ankle  joint alignments normal. Oblique fracture of the RIGHT tibial diaphysis at the junction of the middle and distal thirds, with apex medial and anterior angulation and slight lateral displacement. Comminuted fracture of the RIGHT fibular diaphysis adjacent to the tibial fracture with similar anterior and medial apex angulation and slightly greater degree of displacement. No additional fractures, dislocation, or bone destruction. IMPRESSION: Displaced angulated fractures of the mid to distal RIGHT tibial and fibular diaphyses. Electronically Signed   By: Ulyses Southward M.D.   On: 07/18/2018 21:21   Dg C-arm 1-60 Min  Result Date: 07/19/2018 CLINICAL DATA:  Right tibial nail EXAM: DG C-ARM 61-120 MIN; RIGHT TIBIA AND FIBULA - 2 VIEW COMPARISON:  Right tib-fib radiographs dated 07/18/2018 FLUOROSCOPY TIME:  2 minutes, 36 seconds FINDINGS: Status post IM nail with single proximal interlocking and two distal interlocking screw fixation of a distal tibial shaft fracture. Fracture fragments are in near anatomic alignment and position, with minimal lateral displacement. Comminuted/segmental distal fibular shaft fracture, in near anatomic alignment and position. IMPRESSION: Status post ORIF of a distal tibial shaft fracture. Comminuted distal fibular shaft fracture, with improved alignment. Electronically Signed   By: Charline Bills M.D.   On: 07/19/2018 14:57    Disposition: Discharge disposition: 01-Home or Self Care         Follow-up Information    Kathryne Hitch, MD. Schedule an appointment as soon as possible for a visit in 2 week(s).   Specialty:  Orthopedic Surgery Contact information: [address] [place] [address] [phone]            Signed: Kathryne Hitch 07/21/2018, 2:40 PM

## 2018-07-21 NOTE — Progress Notes (Signed)
Discharge instructions given. Pt verbalized understanding and all questions were answered.  

## 2018-07-21 NOTE — Progress Notes (Signed)
Subjective: 2 Days Post-Op Procedure(s) (LRB): INTRAMEDULLARY (IM) NAIL TIBIAL, RIGHT (Right) Patient reports pain as moderate.    Objective: Vital signs in last 24 hours: Temp:  [98.2 F (36.8 C)-98.9 F (37.2 C)] 98.6 F (37 C) (02/11 0434) Pulse Rate:  [74-90] 80 (02/11 0434) Resp:  [15-17] 15 (02/11 0434) BP: (105-113)/(70-73) 111/72 (02/11 0434) SpO2:  [96 %-98 %] 98 % (02/11 0434)  Intake/Output from previous day: 02/10 0701 - 02/11 0700 In: 1080 [P.O.:1080] Out: 1660 [Urine:1660] Intake/Output this shift: No intake/output data recorded.  Recent Labs    07/19/18 0409  HGB 13.9   Recent Labs    07/19/18 0409  WBC 12.7*  RBC 4.57  HCT 40.3  PLT 271   Recent Labs    07/19/18 0409  NA 137  K 4.1  CL 103  CO2 24  BUN 12  CREATININE 1.01  GLUCOSE 119*  CALCIUM 8.7*   No results for input(s): LABPT, INR in the last 72 hours.  Sensation intact distally Intact pulses distally Dorsiflexion/Plantar flexion intact Incision: no drainage No cellulitis present Compartment soft   Assessment/Plan: 2 Days Post-Op Procedure(s) (LRB): INTRAMEDULLARY (IM) NAIL TIBIAL, RIGHT (Right) Up with therapy Discharge home with home health this afternoon.      Rick Wolf 07/21/2018, 7:47 AM

## 2018-08-03 ENCOUNTER — Encounter (INDEPENDENT_AMBULATORY_CARE_PROVIDER_SITE_OTHER): Payer: Self-pay | Admitting: Physician Assistant

## 2018-08-03 ENCOUNTER — Ambulatory Visit (INDEPENDENT_AMBULATORY_CARE_PROVIDER_SITE_OTHER): Payer: Managed Care, Other (non HMO)

## 2018-08-03 ENCOUNTER — Ambulatory Visit (INDEPENDENT_AMBULATORY_CARE_PROVIDER_SITE_OTHER): Payer: Managed Care, Other (non HMO) | Admitting: Physician Assistant

## 2018-08-03 DIAGNOSIS — S82401D Unspecified fracture of shaft of right fibula, subsequent encounter for closed fracture with routine healing: Secondary | ICD-10-CM

## 2018-08-03 DIAGNOSIS — S82201D Unspecified fracture of shaft of right tibia, subsequent encounter for closed fracture with routine healing: Secondary | ICD-10-CM

## 2018-08-03 NOTE — Progress Notes (Signed)
Office Visit Note   Patient: Rick Wolf           Date of Birth: Nov 02, 1975           MRN: [id number] Visit Date: 08/03/2018              Requested by: No referring provider defined for this encounter. PCP: Patient, No Pcp Per   Assessment & Plan: Visit Diagnoses:  1. Closed fracture of shaft of right tibia and fibula with routine healing, subsequent encounter     Plan: Staples removed Steri-Strips applied he is able to get the incisions wet in the shower.  Scar tissue mobilization encouraged.  Elevation wiggling toes encouraged.  Stop taking 81 mg aspirin.  He is weightbearing as tolerated on the right leg.  We will have him follow-up with [place] in 1 month at that time we will obtain AP and lateral view of the right tibia.  He is to continue to work from home only.  Follow-Up Instructions: Return in about 4 weeks (around 08/31/2018) for Radiographs, post op.   Orders:  Orders Placed This Encounter  Procedures  . XR Tibia/Fibula Right   No orders of the defined types were placed in this encounter.     Procedures: No procedures performed   Clinical Data: No additional findings.   Subjective: Chief Complaint  Patient presents with  . Right Leg - Routine Post Op    IM nail closed fracture right tibia/fibula 07/18/2018    HPI Rick Wolf returns now 15 days status post IM nailing right tibia fracture which he sustained due to a mechanical fall.  He has been overall doing well.  He states he has constant pain but only takes Tylenol.  He has had no shortness of breath fevers chills or chest pain. Review of Systems See HPI.  Objective: Vital Signs: There were no vitals taken for this visit.  Physical Exam Constitutional:      Appearance: He is not ill-appearing or diaphoretic.  Pulmonary:     Effort: Pulmonary effort is normal.  Neurological:     Mental Status: He is alert and oriented to person, place, and time.     Ortho Exam Right leg he ambulates without any  assistive device with a slow antalgic gait.  Right calf supple nontender.  Surgical sites are all well approximated with staples no signs of infection.  Dorsiflexion plantarflexion ankle intact.  No gross deformity of the right lower leg.  Positive ecchymosis multiple areas of the right lower leg.  Good range of motion right knee without pain. Specialty Comments:  No specialty comments available.  Imaging: Xr Tibia/fibula Right  Result Date: 08/03/2018 Right tibia 2 views: Status post IM nailing.  Fracture site remains in overall good position alignment.  Signs of callus formation present.  Distal screws remain in good position.  Most proximal screw slight change in position appears to transverse the IM rod.  Fibular fracture with some early signs of consolidation.    PMFS History: Patient Active Problem List   Diagnosis Date Noted  . Closed fracture of shaft of right tibia and fibula 07/18/2018   Past Medical History:  Diagnosis Date  . Anxiety     History reviewed. No pertinent family history.  Past Surgical History:  Procedure Laterality Date  . APPENDECTOMY    . EYE SURGERY    . TIBIA IM NAIL INSERTION Right 07/19/2018   Procedure: INTRAMEDULLARY (IM) NAIL TIBIAL, RIGHT;  Surgeon: Kathryne Hitch, MD;  Location:  [facility] OR;  Service: Orthopedics;  Laterality: Right;   Social History   Occupational History  . Not on file  Tobacco Use  . Smoking status: Never Smoker  . Smokeless tobacco: Never Used  Substance and Sexual Activity  . Alcohol use: Yes    Comment: socially  . Drug use: Never  . Sexual activity: Not on file

## 2018-09-15 ENCOUNTER — Ambulatory Visit (INDEPENDENT_AMBULATORY_CARE_PROVIDER_SITE_OTHER): Payer: Managed Care, Other (non HMO) | Admitting: Orthopaedic Surgery

## 2018-09-15 ENCOUNTER — Telehealth (INDEPENDENT_AMBULATORY_CARE_PROVIDER_SITE_OTHER): Payer: Self-pay | Admitting: Radiology

## 2018-09-15 NOTE — Telephone Encounter (Signed)
Called and left voicemail asking patient to call us back to answer pre screening questions for appointment on 4/8 

## 2018-09-15 NOTE — Telephone Encounter (Signed)
Patient called back to confirm his appointment and also answered "no" to all the screening questions.  Thank you

## 2018-09-16 ENCOUNTER — Other Ambulatory Visit: Payer: Self-pay

## 2018-09-16 ENCOUNTER — Encounter (INDEPENDENT_AMBULATORY_CARE_PROVIDER_SITE_OTHER): Payer: Self-pay | Admitting: Orthopaedic Surgery

## 2018-09-16 ENCOUNTER — Ambulatory Visit (INDEPENDENT_AMBULATORY_CARE_PROVIDER_SITE_OTHER): Payer: Managed Care, Other (non HMO) | Admitting: Orthopaedic Surgery

## 2018-09-16 ENCOUNTER — Ambulatory Visit (INDEPENDENT_AMBULATORY_CARE_PROVIDER_SITE_OTHER): Payer: Managed Care, Other (non HMO)

## 2018-09-16 DIAGNOSIS — S82401D Unspecified fracture of shaft of right fibula, subsequent encounter for closed fracture with routine healing: Secondary | ICD-10-CM

## 2018-09-16 DIAGNOSIS — S82201D Unspecified fracture of shaft of right tibia, subsequent encounter for closed fracture with routine healing: Secondary | ICD-10-CM

## 2018-09-16 NOTE — Progress Notes (Signed)
Office Visit Note   Patient: Rick Wolf           Date of Birth: 11/24/1975           MRN: [id number] Visit Date: 09/16/2018              Requested by: No referring provider defined for this encounter. PCP: Patient, No Pcp Per   Assessment & Plan: Visit Diagnoses:  1. Closed fracture of shaft of right tibia and fibula with routine healing, subsequent encounter     Plan: He will continue to work on range of motion strengthening.  No high impact activities.  We will see him back in a month at that time we will obtain AP and lateral views of his right tibia.  Questions encouraged and answered.  Continue to work on scar tissue mobilization.  Follow-Up Instructions: Return in about 4 weeks (around 10/14/2018).   Orders:  Orders Placed This Encounter  Procedures  . XR Tibia/Fibula Right   No orders of the defined types were placed in this encounter.     Procedures: No procedures performed   Clinical Data: No additional findings.   Subjective: Chief Complaint  Patient presents with  . Right Leg - Follow-up    HPI Rick Wolf returns today 59 days status post right tibia fracture IM nailing.  He is overall doing well still some soreness.  He feels that his strength and range of motion of the leg are improving.  He notes swelling about the leg whenever he is up on it and some soreness particularly if he has been up on the leg for prolonged period time.  He denies any calf pain.  Review of Systems Negative for fevers chills shortness of breath chest pain  Objective: Vital Signs: There were no vitals taken for this visit.  Physical Exam Constitutional:      Appearance: He is not ill-appearing or diaphoretic.  Pulmonary:     Effort: Pulmonary effort is normal.  Neurological:     Mental Status: He is alert and oriented to person, place, and time.  Psychiatric:        Mood and Affect: Mood normal.        Behavior: Behavior normal.     Ortho Exam Surgical incisions all  healing well no signs of infection.  Minimal edema of the right lower leg.  Calf supple nontender.  Right knee good range of motion.  Dorsiflexion plantarflexion right ankle intact.  Right dorsal pedal pulses intact.  Palpable callus at the fracture site with tenderness over the fracture site region. Specialty Comments:  No specialty comments available.  Imaging: Xr Tibia/fibula Right  Result Date: 09/16/2018 Right tibia AP and lateral views: Status post intramedullary nailing without any hardware failure.  Interval healing of both the fibula and tibia fractures.  No change in position overall alignment.    PMFS History: Patient Active Problem List   Diagnosis Date Noted  . Closed fracture of shaft of right tibia and fibula 07/18/2018   Past Medical History:  Diagnosis Date  . Anxiety     History reviewed. No pertinent family history.  Past Surgical History:  Procedure Laterality Date  . APPENDECTOMY    . EYE SURGERY    . TIBIA IM NAIL INSERTION Right 07/19/2018   Procedure: INTRAMEDULLARY (IM) NAIL TIBIAL, RIGHT;  Surgeon: Kathryne HitchBlackman, Christopher Y, MD;  Location: [facility] OR;  Service: Orthopedics;  Laterality: Right;   Social History   Occupational History  . Not on file  Tobacco Use  . Smoking status: Never Smoker  . Smokeless tobacco: Never Used  Substance and Sexual Activity  . Alcohol use: Yes    Comment: socially  . Drug use: Never  . Sexual activity: Not on file

## 2018-10-14 ENCOUNTER — Encounter: Payer: Self-pay | Admitting: Orthopaedic Surgery

## 2018-10-14 ENCOUNTER — Other Ambulatory Visit: Payer: Self-pay

## 2018-10-14 ENCOUNTER — Ambulatory Visit (INDEPENDENT_AMBULATORY_CARE_PROVIDER_SITE_OTHER): Payer: BC Managed Care – PPO

## 2018-10-14 ENCOUNTER — Ambulatory Visit (INDEPENDENT_AMBULATORY_CARE_PROVIDER_SITE_OTHER): Payer: Self-pay | Admitting: Orthopaedic Surgery

## 2018-10-14 DIAGNOSIS — S82201D Unspecified fracture of shaft of right tibia, subsequent encounter for closed fracture with routine healing: Secondary | ICD-10-CM

## 2018-10-14 NOTE — Progress Notes (Signed)
Office Visit Note   Patient: Rick Wolf           Date of Birth: 09/21/1975           MRN: [id number] Visit Date: 10/14/2018              Requested by: No referring provider defined for this encounter. PCP: Patient, No Pcp Per   Assessment & Plan: Visit Diagnoses:  1. Closed fracture of shaft of right tibia with routine healing, unspecified fracture morphology, subsequent encounter     Plan: We will have him continue to work on scar tissue mobilization.  We will also work on strengthening of the right lower leg.  We will see him back in 3 months at that time we will obtain AP and lateral views of the right tibia.  He is still to avoid high impact activities and contact sports.  Questions were encouraged and answered at length.  Follow-up sooner if there is any questions or concerns.  Follow-Up Instructions: Return in about 3 months (around 01/14/2019) for Radiographs.   Orders:  Orders Placed This Encounter  Procedures  . XR Tibia/Fibula Right   No orders of the defined types were placed in this encounter.     Procedures: No procedures performed   Clinical Data: No additional findings.   Subjective: Chief Complaint  Patient presents with  . Right Leg - Follow-up    HPI Rick Wolf returns today nearly 3 months status post IM nailing of right tibia fracture.  He is overall trending towards improvement.  He has been doing some riding of his bike again.  He is doing no running or high impact activities.  States the leg is doing well.  He denies any prominent screws.  He does note prominence at the fracture site. Denies any fevers chills shortness of breath chest pain.  Review of Systems  Constitutional: Negative for chills and fever.     Objective: Vital Signs: There were no vitals taken for this visit.  Physical Exam Constitutional:      Appearance: He is not ill-appearing or diaphoretic.  Neurological:     Mental Status: He is alert and oriented to person,  place, and time.  Psychiatric:        Mood and Affect: Mood normal.        Behavior: Behavior normal.     Ortho Exam Right lower leg atrophy of the calf compared to the left leg.  Surgical incisions are all healing well no signs of infection.  Fracture site is abundant callus formation with prominence.  No ecchymosis or pitting edema of the right lower leg.  Dorsiflexion plantarflexion ankle intact.  Dorsal pedal pulse intact right foot. Specialty Comments:  No specialty comments available.  Imaging: Xr Tibia/fibula Right  Result Date: 10/14/2018 Right tib-fib AP and lateral views: Status post IM nailing without any hardware failure.  Significant Interim callus formation of the distal tibial shaft fracture. Fracture is still quite evident with good callus formation.  No change in overall alignment position of the tibia.    PMFS History: Patient Active Problem List   Diagnosis Date Noted  . Closed fracture of shaft of right tibia with routine healing 10/14/2018  . Closed fracture of shaft of right tibia and fibula 07/18/2018   Past Medical History:  Diagnosis Date  . Anxiety     History reviewed. No pertinent family history.  Past Surgical History:  Procedure Laterality Date  . APPENDECTOMY    . EYE SURGERY    .  TIBIA IM NAIL INSERTION Right 07/19/2018   Procedure: INTRAMEDULLARY (IM) NAIL TIBIAL, RIGHT;  Surgeon: Kathryne Hitch, MD;  Location: [facility] OR;  Service: Orthopedics;  Laterality: Right;   Social History   Occupational History  . Not on file  Tobacco Use  . Smoking status: Never Smoker  . Smokeless tobacco: Never Used  Substance and Sexual Activity  . Alcohol use: Yes    Comment: socially  . Drug use: Never  . Sexual activity: Not on file

## 2019-01-14 ENCOUNTER — Ambulatory Visit: Payer: Self-pay | Admitting: Orthopaedic Surgery

## 2019-01-14 ENCOUNTER — Ambulatory Visit: Payer: Self-pay | Admitting: Physician Assistant

## 2019-01-18 ENCOUNTER — Ambulatory Visit (INDEPENDENT_AMBULATORY_CARE_PROVIDER_SITE_OTHER): Payer: BC Managed Care – PPO | Admitting: Orthopaedic Surgery

## 2019-01-18 ENCOUNTER — Ambulatory Visit: Payer: Self-pay

## 2019-01-18 ENCOUNTER — Other Ambulatory Visit: Payer: Self-pay

## 2019-01-18 ENCOUNTER — Encounter: Payer: Self-pay | Admitting: Orthopaedic Surgery

## 2019-01-18 DIAGNOSIS — S82201D Unspecified fracture of shaft of right tibia, subsequent encounter for closed fracture with routine healing: Secondary | ICD-10-CM

## 2019-01-18 DIAGNOSIS — S82401D Unspecified fracture of shaft of right fibula, subsequent encounter for closed fracture with routine healing: Secondary | ICD-10-CM | POA: Diagnosis not present

## 2019-01-18 NOTE — Progress Notes (Signed)
The patient comes in today for follow-up now about 6 months status post a right tib-fib fracture.  This injury was sustained after a fall down some steps.  We placed intramedullary rod into the right tibia.  He is gotten back to some activities.  He is running once.  He is anxious to get back to playing tennis.  He has no real issues.  He does not walk with a limp.  He has full range of motion of his right knee and right ankle.  You can feel where he has callus formation around his fracture and the stress that area on his right tibia and is pain-free.  2 views of the right tibia are obtained and show the fracture is consolidated and healed completely of both the tibia and the fibula.  At this point follow-up can be as needed.  All question concerns were answered and addressed.

## 2020-10-13 IMAGING — RF DG TIBIA/FIBULA 2V*R*
1 series · 5 of 5 positions shown · non-contrast
Comparison: Right tib-fib radiographs dated 07/18/2018

FLUOROSCOPY TIME:  2 minutes, 36 seconds

CLINICAL DATA: Right tibial nail

EXAM:
DG C-ARM 61-120 MIN; RIGHT TIBIA AND FIBULA - 2 VIEW

[Series 1: run · 5 of 5 slices shown]
[im 1/5]
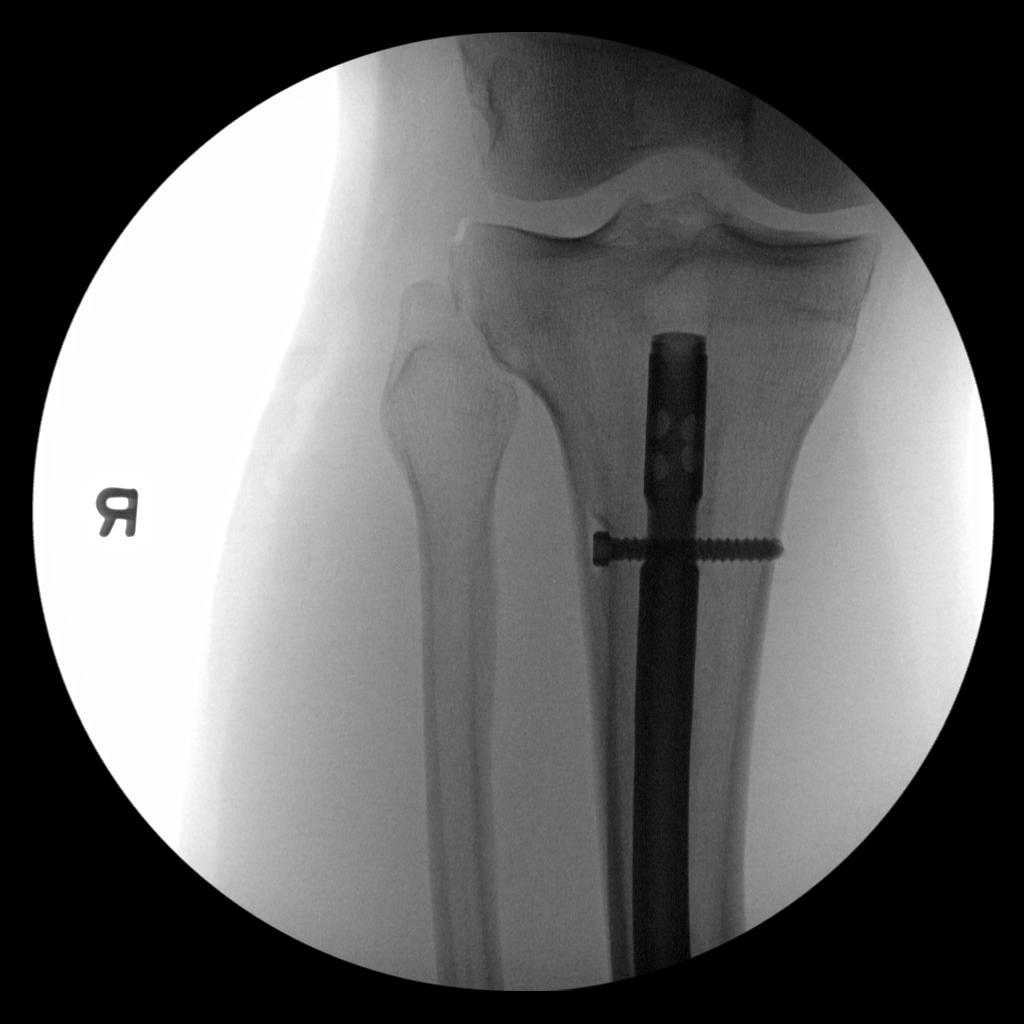
[im 2/5]
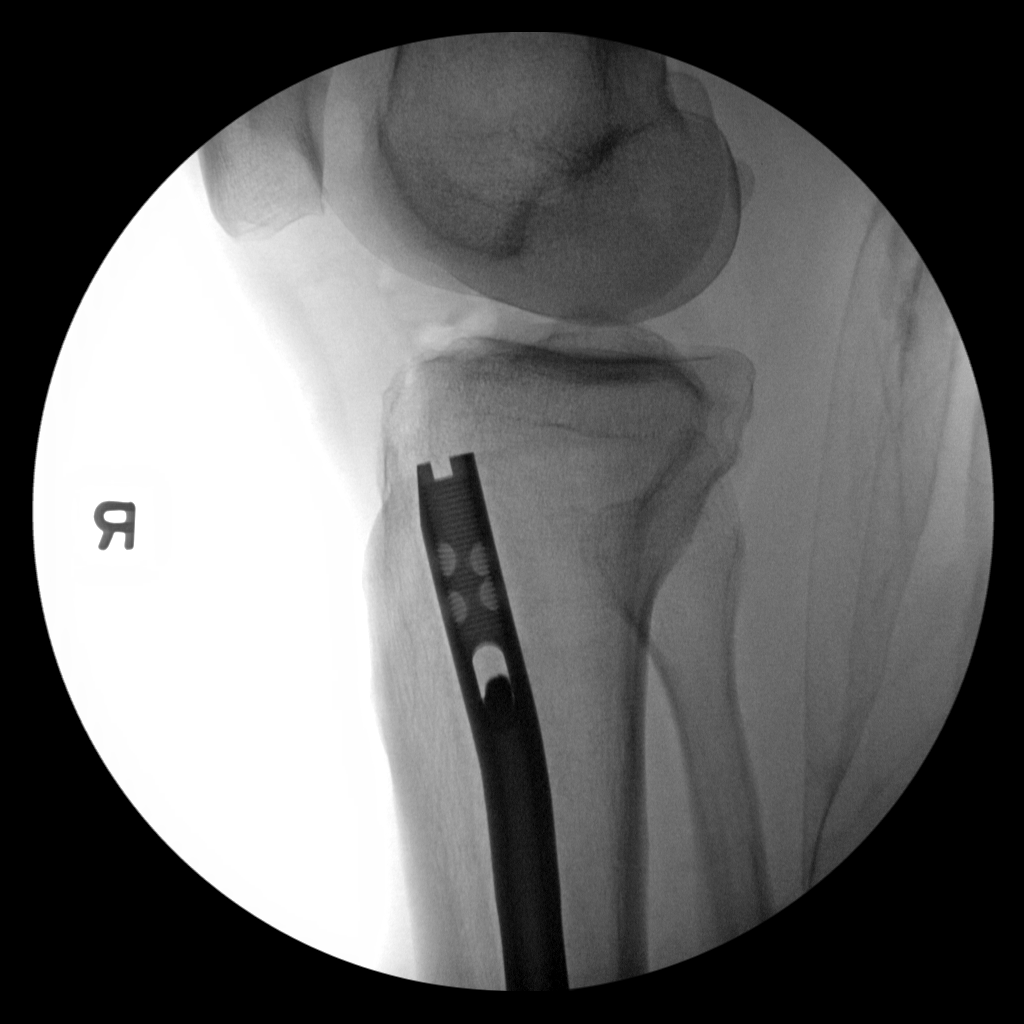
[im 3/5]
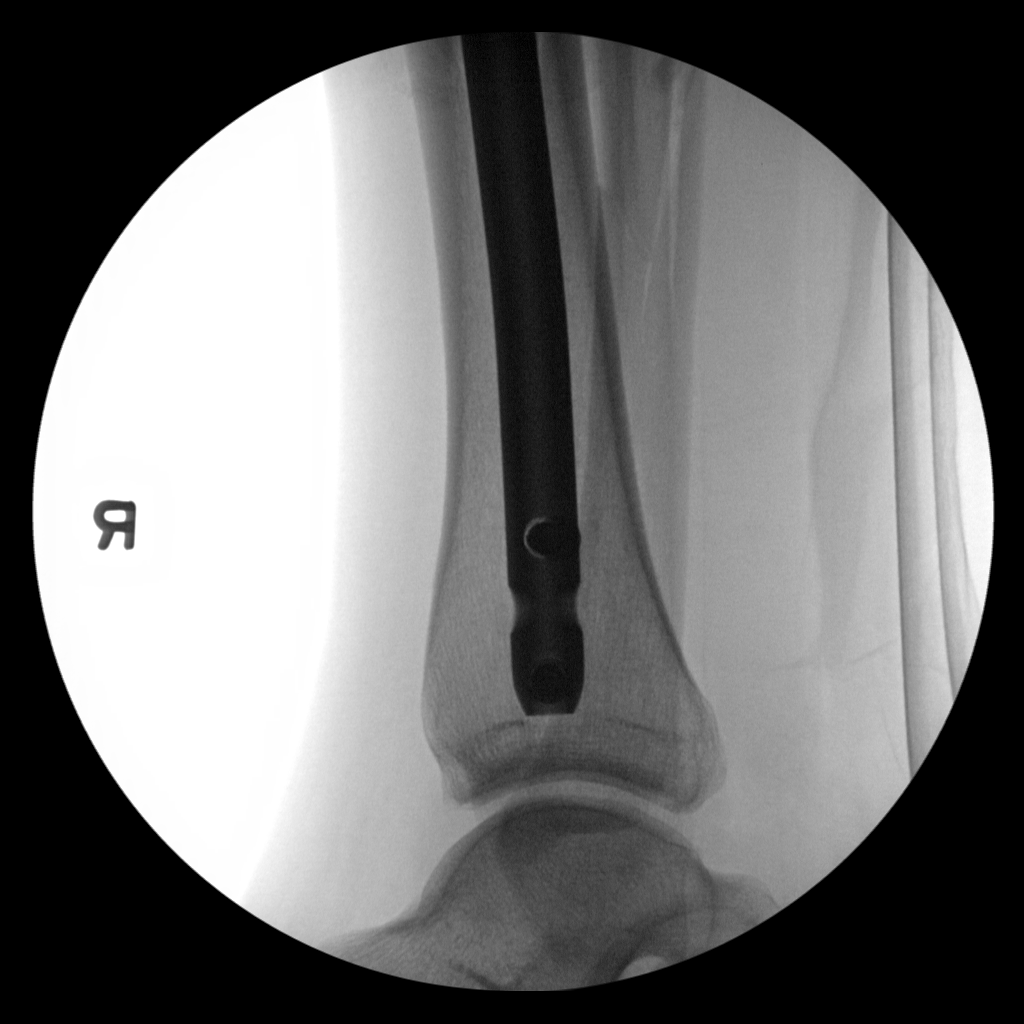
[im 4/5]
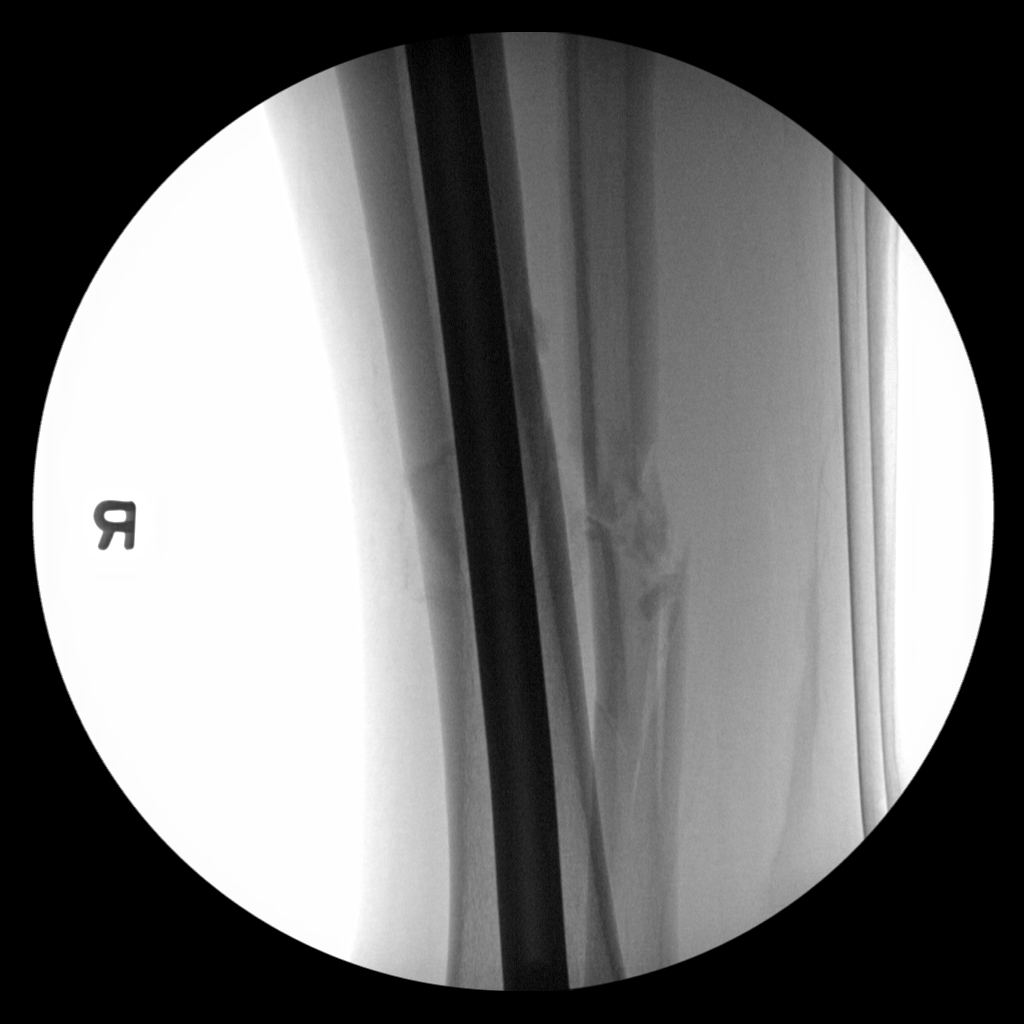
[im 5/5]
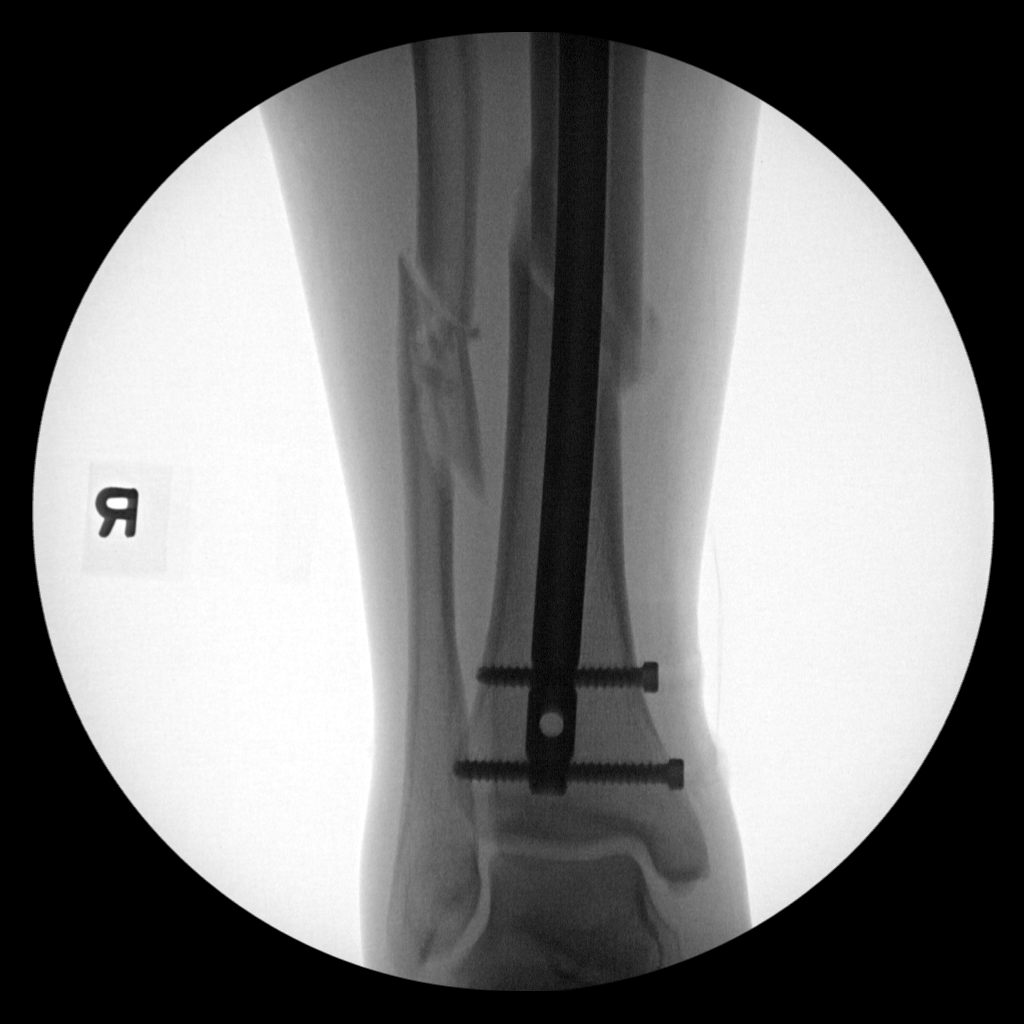

[5 of 5 positions shown; findings below may reference images not displayed]

FINDINGS: Status post IM nail with single proximal interlocking and two distal
interlocking screw fixation of a distal tibial shaft fracture.
Fracture fragments are in near anatomic alignment and position, with
minimal lateral displacement.

Comminuted/segmental distal fibular shaft fracture, in near anatomic
alignment and position.
IMPRESSION: Status post ORIF of a distal tibial shaft fracture.

Comminuted distal fibular shaft fracture, with improved alignment.

## 2022-01-29 ENCOUNTER — Other Ambulatory Visit: Payer: Self-pay

## 2022-01-29 ENCOUNTER — Emergency Department (HOSPITAL_COMMUNITY): Payer: Managed Care, Other (non HMO)

## 2022-01-29 ENCOUNTER — Encounter (HOSPITAL_COMMUNITY): Payer: Self-pay

## 2022-01-29 ENCOUNTER — Emergency Department (HOSPITAL_COMMUNITY)
Admission: EM | Admit: 2022-01-29 | Discharge: 2022-01-29 | Disposition: A | Discharge status: Home / Self Care | Payer: Managed Care, Other (non HMO) | Attending: Emergency Medicine | Admitting: Emergency Medicine

## 2022-01-29 DIAGNOSIS — M4802 Spinal stenosis, cervical region: Secondary | ICD-10-CM | POA: Insufficient documentation

## 2022-01-29 DIAGNOSIS — R200 Anesthesia of skin: Secondary | ICD-10-CM | POA: Diagnosis present

## 2022-01-29 DIAGNOSIS — R202 Paresthesia of skin: Secondary | ICD-10-CM

## 2022-01-29 LAB — CBC
HCT: 45.4 % (ref 39.0–52.0)
Hemoglobin: 15.9 g/dL (ref 13.0–17.0)
MCH: 31.1 pg (ref 26.0–34.0)
MCHC: 35 g/dL (ref 30.0–36.0)
MCV: 88.7 fL (ref 80.0–100.0)
Platelets: 307 10*3/uL (ref 150–400)
RBC: 5.12 MIL/uL (ref 4.22–5.81)
RDW: 11.9 % (ref 11.5–15.5)
WBC: 6.6 10*3/uL (ref 4.0–10.5)
nRBC: 0 % (ref 0.0–0.2)

## 2022-01-29 LAB — BASIC METABOLIC PANEL
Anion gap: 11 (ref 5–15)
BUN: 18 mg/dL (ref 6–20)
CO2: 22 mmol/L (ref 22–32)
Calcium: 9.6 mg/dL (ref 8.9–10.3)
Chloride: 104 mmol/L (ref 98–111)
Creatinine, Ser: 1.15 mg/dL (ref 0.61–1.24)
GFR, Estimated: >60 mL/min (ref >60)
Glucose, Bld: 102 mg/dL — ABNORMAL HIGH (ref 70–99)
Potassium: 4.1 mmol/L (ref 3.5–5.1)
Sodium: 137 mmol/L (ref 135–145)

## 2022-01-29 LAB — TROPONIN I (HIGH SENSITIVITY)
Troponin I (High Sensitivity): <2 ng/L (ref <18)
Troponin I (High Sensitivity): 3 ng/L (ref <18)

## 2022-01-29 MED ORDER — GADOBUTROL 1 MMOL/ML IV SOLN
10.0000 mL | Freq: Once | INTRAVENOUS | Status: AC | PRN
  Administered 2022-01-29: 10 mL via INTRAVENOUS

## 2022-01-29 MED ORDER — LORAZEPAM 2 MG/ML IJ SOLN
0.5000 mg | Freq: Once | INTRAMUSCULAR | Status: AC | PRN
  Administered 2022-01-29: 0.5 mg via INTRAVENOUS
  Filled 2022-01-29: qty 1

## 2022-01-29 NOTE — ED Provider Notes (Signed)
[facility] EMERGENCY DEPARTMENT Provider Note   CSN: [id number] Arrival date & time: 01/29/22  3154     History {Add pertinent medical, surgical, social history, OB history to HPI:1} Chief Complaint  Patient presents with   Numbness    Rick Wolf is a 46 y.o. male.  HPI     3 days of waxing and waning numbness/tingling discomfort, axillary  Sunday he laid down and from back of arm shoulder to arm, tingling left leg too.    This morning had nausea too  Nothing seems to make it better or worse, Right now not as sore as it was.  Can't really say if it is worse with walking or exertion. Rather peristent, more times it is there ethan not.  Not changed by position.  Inside of left leg to foot tingling but has not been as persistent.  Occasionally will feel something on right but mostly on left side.    No shortness of breath Nausea just started today Felt a little achy, fatigue, going to sleep early, not being able to sleep because of discomfort No chest pain, back pain, neck pain, headache  Denies weakness, difficulty talking or walking, visual changes or facial droop.  Left arm does feel heavy at times   No falls or trauma.    Occ etoh, no smoking or drugs No family hx of early eart disease, stroke or MS    Past Medical History:  Diagnosis Date   Anxiety       Home Medications Prior to Admission medications   Medication Sig Start Date End Date Taking? Authorizing Provider  acetaminophen (TYLENOL) 325 MG tablet Take 325-650 mg by mouth every 6 (six) hours as needed (for pain or headaches).    [provider]  LORazepam (ATIVAN) 1 MG tablet Take 1 mg by mouth daily as needed for anxiety.  06/30/18   [provider]  methocarbamol (ROBAXIN) 500 MG tablet Take 1 tablet (500 mg total) by mouth every 6 (six) hours as needed for muscle spasms. 07/21/18   Kathryne Hitch, MD  sertraline (ZOLOFT) 100 MG tablet Take 100 mg by  mouth daily.  05/28/18   [provider]      Allergies    Shellfish allergy    Review of Systems   Review of Systems  Physical Exam Updated Vital Signs BP (!) 130/94   Pulse 72   Temp 98.2 F (36.8 C) (Oral)   Resp 12   Ht 6' (1.829 m)   Wt 99.8 kg   SpO2 99%   BMI 29.84 kg/m  Physical Exam  ED Results / Procedures / Treatments   Labs (all labs ordered are listed, but only abnormal results are displayed) Labs Reviewed  BASIC METABOLIC PANEL - Abnormal; Notable for the following components:      Result Value   Glucose, Bld 102 (*)    All other components within normal limits  CBC  TROPONIN I (HIGH SENSITIVITY)  TROPONIN I (HIGH SENSITIVITY)    EKG None  Radiology DG Chest 2 View  Result Date: 01/29/2022 CLINICAL DATA:  Provided history: Chest pain. Additional history provided: Left arm tenderness, nausea. EXAM: CHEST - 2 VIEW COMPARISON:  None Available. FINDINGS: Heart size within normal limits. No appreciable airspace consolidation or pulmonary edema. No evidence of pleural effusion or pneumothorax. No acute bony abnormality identified. Mild dextrocurvature of the thoracic spine. IMPRESSION: No evidence of acute cardiopulmonary abnormality. Electronically Signed   By: Jackey Loge  D.O.   On: 01/29/2022 10:07    Procedures Procedures  {Document cardiac monitor, telemetry assessment procedure when appropriate:1}  Medications Ordered in ED Medications - No data to display  ED Course/ Medical Decision Making/ A&P                           Medical Decision Making Amount and/or Complexity of Data Reviewed Labs: ordered. Radiology: ordered.  Risk Prescription drug management.   ***  {Document critical care time when appropriate:1} {Document review of labs and clinical decision tools ie heart score, Chads2Vasc2 etc:1}  {Document your independent review of radiology images, and any outside records:1} {Document your discussion with family members,  caretakers, and with consultants:1} {Document social determinants of health affecting pt's care:1} {Document your decision making why or why not admission, treatments were needed:1} Final Clinical Impression(s) / ED Diagnoses Final diagnoses:  None    Rx / DC Orders ED Discharge Orders     None

## 2022-01-29 NOTE — ED Notes (Signed)
Patient transported to MRI 

## 2022-01-29 NOTE — ED Provider Triage Note (Signed)
Emergency Medicine Provider Triage Evaluation Note  Rick Wolf , a 46 y.o. male  was evaluated in triage.  Pt complains of left arm tingling and numbness x3 to 4 days.  Its been coming and going until today which is constant.  No chest pain or shortness of breath, recent travel to [place] last week.  No history of PEs, not anticoagulated.  He is nauseated but no vomiting, no shortness of breath hemoptysis..  Review of Systems  Per HPI  Physical Exam  BP (!) 138/94 (BP Location: Left Arm)   Pulse 100   Temp 98.2 F (36.8 C) (Oral)   Resp 16   Ht 6' (1.829 m)   Wt 99.8 kg   SpO2 96%   BMI 29.84 kg/m  Gen:   Awake, no distress   Resp:  Normal effort  MSK:   Moves extremities without difficulty  Other:  S1-S2, radial pulse 2+ symmetric.  Lungs clear to auscultation bilaterally  Medical Decision Making  Medically screening exam initiated at 9:45 AM.  Appropriate orders placed.  Rick Wolf was informed that the remainder of the evaluation will be completed by another provider, this initial triage assessment does not replace that evaluation, and the importance of remaining in the ED until their evaluation is complete.     Theron Arista, PA-C 01/29/22 [phone]

## 2022-01-29 NOTE — ED Triage Notes (Signed)
Pt arrived POV from home c/o left arm numbness and tingling x4 days. Pt also states he has been more fatigued then normal and has had some nausea.

## 2022-04-17 ENCOUNTER — Ambulatory Visit: Payer: Managed Care, Other (non HMO) | Admitting: Family Medicine

## 2022-04-18 ENCOUNTER — Ambulatory Visit: Payer: Managed Care, Other (non HMO) | Admitting: Family Medicine
